# Patient Record
Sex: Male | Born: 1940
Health system: Southern US, Community
[De-identification: ages and names within clinical notes are randomized; demographics above are authoritative.]

## PROBLEM LIST (undated history)

## (undated) DIAGNOSIS — E7800 Pure hypercholesterolemia, unspecified: Secondary | ICD-10-CM

## (undated) DIAGNOSIS — R251 Tremor, unspecified: Secondary | ICD-10-CM

## (undated) DIAGNOSIS — I10 Essential (primary) hypertension: Secondary | ICD-10-CM

## (undated) DIAGNOSIS — Z923 Personal history of irradiation: Secondary | ICD-10-CM

## (undated) DIAGNOSIS — I2510 Atherosclerotic heart disease of native coronary artery without angina pectoris: Secondary | ICD-10-CM

## (undated) DIAGNOSIS — I219 Acute myocardial infarction, unspecified: Secondary | ICD-10-CM

## (undated) DIAGNOSIS — C801 Malignant (primary) neoplasm, unspecified: Secondary | ICD-10-CM

## (undated) HISTORY — PX: CORONARY ANGIOPLASTY WITH STENT PLACEMENT: SHX49

## (undated) HISTORY — DX: Malignant (primary) neoplasm, unspecified: C80.1

## (undated) HISTORY — DX: Pure hypercholesterolemia, unspecified: E78.00

## (undated) HISTORY — PX: APPENDECTOMY: SHX54

---

## 2014-05-12 ENCOUNTER — Emergency Department (HOSPITAL_COMMUNITY)
Admission: EM | Admit: 2014-05-12 | Discharge: 2014-05-12 | Disposition: A | Discharge status: Home / Self Care | Payer: Medicare Other | Attending: Emergency Medicine | Admitting: Emergency Medicine

## 2014-05-12 ENCOUNTER — Emergency Department (HOSPITAL_COMMUNITY): Payer: Medicare Other

## 2014-05-12 ENCOUNTER — Encounter (HOSPITAL_COMMUNITY): Payer: Self-pay | Admitting: Neurology

## 2014-05-12 DIAGNOSIS — S4991XA Unspecified injury of right shoulder and upper arm, initial encounter: Secondary | ICD-10-CM | POA: Diagnosis present

## 2014-05-12 DIAGNOSIS — S0990XA Unspecified injury of head, initial encounter: Secondary | ICD-10-CM | POA: Diagnosis not present

## 2014-05-12 DIAGNOSIS — S42001A Fracture of unspecified part of right clavicle, initial encounter for closed fracture: Secondary | ICD-10-CM

## 2014-05-12 DIAGNOSIS — Z9861 Coronary angioplasty status: Secondary | ICD-10-CM | POA: Insufficient documentation

## 2014-05-12 DIAGNOSIS — Y9384 Activity, sleeping: Secondary | ICD-10-CM | POA: Insufficient documentation

## 2014-05-12 DIAGNOSIS — S42031A Displaced fracture of lateral end of right clavicle, initial encounter for closed fracture: Secondary | ICD-10-CM | POA: Insufficient documentation

## 2014-05-12 DIAGNOSIS — W06XXXA Fall from bed, initial encounter: Secondary | ICD-10-CM | POA: Diagnosis not present

## 2014-05-12 DIAGNOSIS — Y929 Unspecified place or not applicable: Secondary | ICD-10-CM | POA: Insufficient documentation

## 2014-05-12 DIAGNOSIS — I10 Essential (primary) hypertension: Secondary | ICD-10-CM | POA: Diagnosis not present

## 2014-05-12 DIAGNOSIS — Z87891 Personal history of nicotine dependence: Secondary | ICD-10-CM | POA: Diagnosis not present

## 2014-05-12 DIAGNOSIS — Y999 Unspecified external cause status: Secondary | ICD-10-CM | POA: Insufficient documentation

## 2014-05-12 HISTORY — DX: Essential (primary) hypertension: I10

## 2014-05-12 MED ORDER — MORPHINE SULFATE 4 MG/ML IJ SOLN
4.0000 mg | Freq: Once | INTRAMUSCULAR | Status: AC
  Administered 2014-05-12: 4 mg via INTRAVENOUS
  Filled 2014-05-12: qty 1

## 2014-05-12 MED ORDER — HYDROCODONE-ACETAMINOPHEN 5-325 MG PO TABS: 1.0000 | ORAL_TABLET | ORAL | Status: DC | PRN

## 2014-05-12 MED ORDER — SODIUM CHLORIDE 0.9 % IV BOLUS (SEPSIS)
500.0000 mL | Freq: Once | INTRAVENOUS | Status: AC
  Administered 2014-05-12: 500 mL via INTRAVENOUS

## 2014-05-12 MED ORDER — ONDANSETRON HCL 4 MG/2ML IJ SOLN
4.0000 mg | INTRAMUSCULAR | Status: AC
  Administered 2014-05-12: 4 mg via INTRAVENOUS
  Filled 2014-05-12: qty 2

## 2014-05-12 NOTE — ED Notes (Signed)
Pt reports this morning he fell out of the bed, c/o right shoulder pain. Reports he heard a pop. Sensation intact, painful with movement. Denies LOC, takes plavix. Reports hit head on edge of bed, no neck pain.

## 2014-05-12 NOTE — ED Provider Notes (Signed)
CSN: [id number]     Arrival date & time 05/12/14  1049 History   First MD Initiated Contact with Patient 05/12/14 1124     Chief Complaint  Patient presents with  . Shoulder Pain   (Consider location/radiation/quality/duration/timing/severity/associated sxs/prior Treatment) HPI Ruben Conrad is a 74 yo male presenting with shoulder pain.  He states he rolled out of bed while sleeping, appr 3 am this morning, landing on his right side.  He immediately felt pain in his right shoulder and has had difficulty with movement since the fall.  When he is not moving he denies any pain, but on movement of the right arm, he rates the pain as 10/10.  He reports also bumping the back of his head on the bedside table but denies any LOC.   Past Medical History  Diagnosis Date  . Hypertension    Past Surgical History  Procedure Laterality Date  . Coronary angioplasty with stent placement     No family history on file. History  Substance Use Topics  . Smoking status: Former Research scientist (life sciences)  . Smokeless tobacco: Not on file  . Alcohol Use: No    Review of Systems  Constitutional: Negative for fever and chills.  HENT: Negative for sore throat.   Eyes: Negative for visual disturbance.  Respiratory: Negative for cough and shortness of breath.   Cardiovascular: Negative for chest pain and leg swelling.  Gastrointestinal: Negative for nausea, vomiting and diarrhea.  Genitourinary: Negative for dysuria.  Musculoskeletal: Positive for myalgias and arthralgias.  Skin: Negative for rash.  Neurological: Negative for weakness, numbness and headaches.      Allergies  Review of patient's allergies indicates no known allergies.  Home Medications   Prior to Admission medications   Not on File   BP 107/71 mmHg  Pulse 59  Temp(Src) 98.2 F (36.8 C) (Oral)  Resp 15  SpO2 94% Physical Exam  Constitutional: He is oriented to person, place, and time. He appears well-developed and well-nourished. No  distress.  HENT:  Head: Normocephalic and atraumatic.  Mouth/Throat: Oropharynx is clear and moist. No oropharyngeal exudate.  Eyes: Conjunctivae are normal.  Neck: Neck supple. No thyromegaly present.  Cardiovascular: Normal rate, regular rhythm and intact distal pulses.   Pulmonary/Chest: Effort normal and breath sounds normal. No respiratory distress. He has no wheezes. He has no rales. He exhibits no tenderness.  Abdominal: Soft. There is no tenderness.  Musculoskeletal: He exhibits tenderness.  Clavicle crepitus,  TTP over right shoulder, clavicle and scapula. N/V intact. Decreased ROM   Lymphadenopathy:    He has no cervical adenopathy.  Neurological: He is alert and oriented to person, place, and time. He has normal strength. No cranial nerve deficit or sensory deficit. GCS eye subscore is 4. GCS verbal subscore is 5. GCS motor subscore is 6.  Cranial nerves 2-12 intact  Skin: Skin is warm and dry. No rash noted. He is not diaphoretic.  Psychiatric: He has a normal mood and affect.  Nursing note and vitals reviewed.   ED Course  Procedures (including critical care time) Labs Review Labs Reviewed - No data to display  Imaging Review Dg Chest 2 View  05/12/2014   CLINICAL DATA:  Shoulder pain. Fell today. Pain in the right shoulder.  EXAM: CHEST  2 VIEW  COMPARISON:  None.  FINDINGS: Heart size is within normal limits. There is minimal subsegmental atelectasis in the left lower lobe. There are no focal consolidations or pleural effusions. No pulmonary edema. No pneumothorax or  acute, displaced rib fractures. Note is made of deformity of the right shoulder, further evaluated on shoulder films of the same day.  IMPRESSION: No evidence for acute cardiopulmonary abnormality.  Deformity of the right shoulder.   Electronically Signed   By: Nolon Nations M.D.   On: 05/12/2014 12:51   Dg Shoulder Right  05/12/2014   CLINICAL DATA:  RIGHT shoulder pain, fell today, initial encounter   EXAM: RIGHT SHOULDER - 2+ VIEW  COMPARISON:  None  FINDINGS: Osseous demineralization.  Displaced distal RIGHT clavicular fracture.  AC joint alignment suboptimally delineated.  No glenohumeral fracture or dislocation.  Visualized RIGHT ribs intact.  IMPRESSION: Displaced distal RIGHT clavicular fracture.   Electronically Signed   By: Lavonia Dana M.D.   On: 05/12/2014 12:48   Ct Head Wo Contrast  05/12/2014   CLINICAL DATA:  [place] out of bed.  Pain.  EXAM: CT HEAD WITHOUT CONTRAST  CT CERVICAL SPINE WITHOUT CONTRAST  TECHNIQUE: Multidetector CT imaging of the head and cervical spine was performed following the standard protocol without intravenous contrast. Multiplanar CT image reconstructions of the cervical spine were also generated.  COMPARISON:  None.  FINDINGS: CT HEAD FINDINGS  Generalized atrophy. Negative for hydrocephalus. Mild chronic microvascular ischemic change in the white matter.  Negative for acute infarct, hemorrhage, or mass lesion. Negative for skull fracture.  CT CERVICAL SPINE FINDINGS  Normal cervical alignment.  Negative for fracture or mass  Disc degeneration and spondylosis C3 through C7 left greater than right with uncinate spurring. Left foraminal narrowing at C3-4 and C4-5 and C5-6.  IMPRESSION: No acute abnormality in the head or cervical spine.   Electronically Signed   By: Franchot Gallo M.D.   On: 05/12/2014 12:27   Ct Cervical Spine Wo Contrast  05/12/2014   CLINICAL DATA:  [place] out of bed.  Pain.  EXAM: CT HEAD WITHOUT CONTRAST  CT CERVICAL SPINE WITHOUT CONTRAST  TECHNIQUE: Multidetector CT imaging of the head and cervical spine was performed following the standard protocol without intravenous contrast. Multiplanar CT image reconstructions of the cervical spine were also generated.  COMPARISON:  None.  FINDINGS: CT HEAD FINDINGS  Generalized atrophy. Negative for hydrocephalus. Mild chronic microvascular ischemic change in the white matter.  Negative for acute infarct,  hemorrhage, or mass lesion. Negative for skull fracture.  CT CERVICAL SPINE FINDINGS  Normal cervical alignment.  Negative for fracture or mass  Disc degeneration and spondylosis C3 through C7 left greater than right with uncinate spurring. Left foraminal narrowing at C3-4 and C4-5 and C5-6.  IMPRESSION: No acute abnormality in the head or cervical spine.   Electronically Signed   By: Franchot Gallo M.D.   On: 05/12/2014 12:27     EKG Interpretation None      MDM   Final diagnoses:  Clavicle fracture, right, closed, initial encounter   74 yo presents with with shoulder pain after mechanical fall. His x-ray shows displaced distal right clavicular fracture. CXR negative for pneumothorax and he is n/v intact distally.  Discussed case with Dr. Darl Householder. Pain managed in ED. Sling provided and advised to follow up with orthopedics as soon as possible for mgmt of clavicle fracture. Pt is well-appearing, in no acute distress and vital signs reviewed and not concerning. He appears safe to be discharged.  Return precautions provided.    Patient will be dc home & is agreeable with above plan.   Filed Vitals:   05/12/14 1300 05/12/14 1315 05/12/14 1356 05/12/14 1415  BP: 123/72 125/72 90/76 118/62  Pulse: 65 58 67 62  Temp:      TempSrc:      Resp:   20   SpO2: 92% 91% 97% 95%   Meds given in ED:  Medications  morphine 4 MG/ML injection 4 mg (4 mg Intravenous Given 05/12/14 1253)  ondansetron (ZOFRAN) injection 4 mg (4 mg Intravenous Given 05/12/14 1253)  sodium chloride 0.9 % bolus 500 mL (0 mLs Intravenous Stopped 05/12/14 1423)    Discharge Medication List as of 05/12/2014  1:45 PM    START taking these medications   Details  HYDROcodone-acetaminophen (NORCO/VICODIN) 5-325 MG per tablet Take 1-2 tablets by mouth every 4 (four) hours as needed., Starting 05/12/2014, Until Discontinued, Print           Britt Bottom, NP 05/14/14 8264  Wandra Arthurs, MD 05/15/14 1045

## 2014-05-12 NOTE — ED Notes (Signed)
Pt is still in imaging.

## 2014-05-12 NOTE — Discharge Instructions (Signed)
Please follow the directions provided.  Be sure to follow-up with the orthopedic doctor for further management.  Wear your sling to immobilize your arm while it is healing.  You may take vicodin every 4 hours for pain.  Don't hesitate to return for any new, worsening or concerning symptoms.     SEEK MEDICAL CARE IF:  Your medicine is not helping to relieve pain and swelling.  SEEK IMMEDIATE MEDICAL CARE IF:  Your arm is numb, cold, or pale, even when the splint is loose.

## 2014-05-12 NOTE — ED Notes (Signed)
I gave the patient a cup of ice water per Dr. Darl Householder.

## 2014-05-21 ENCOUNTER — Other Ambulatory Visit: Payer: Self-pay | Admitting: Family Medicine

## 2014-05-21 DIAGNOSIS — I714 Abdominal aortic aneurysm, without rupture, unspecified: Secondary | ICD-10-CM

## 2014-05-27 ENCOUNTER — Ambulatory Visit
Admission: RE | Admit: 2014-05-27 | Discharge: 2014-05-27 | Disposition: A | Discharge status: Home / Self Care | Payer: Medicare Other | Source: Ambulatory Visit | Attending: Family Medicine | Admitting: Family Medicine

## 2014-05-27 DIAGNOSIS — I714 Abdominal aortic aneurysm, without rupture, unspecified: Secondary | ICD-10-CM

## 2015-06-15 DIAGNOSIS — I10 Essential (primary) hypertension: Secondary | ICD-10-CM | POA: Diagnosis not present

## 2015-06-15 DIAGNOSIS — G250 Essential tremor: Secondary | ICD-10-CM | POA: Diagnosis not present

## 2015-06-15 DIAGNOSIS — Z0000 Encounter for general adult medical examination without abnormal findings: Secondary | ICD-10-CM | POA: Diagnosis not present

## 2015-06-15 DIAGNOSIS — Z1389 Encounter for screening for other disorder: Secondary | ICD-10-CM | POA: Diagnosis not present

## 2015-06-15 DIAGNOSIS — E782 Mixed hyperlipidemia: Secondary | ICD-10-CM | POA: Diagnosis not present

## 2015-12-17 DIAGNOSIS — D72829 Elevated white blood cell count, unspecified: Secondary | ICD-10-CM | POA: Diagnosis not present

## 2015-12-17 DIAGNOSIS — R05 Cough: Secondary | ICD-10-CM | POA: Diagnosis not present

## 2015-12-17 DIAGNOSIS — M25552 Pain in left hip: Secondary | ICD-10-CM | POA: Diagnosis not present

## 2015-12-17 DIAGNOSIS — W19XXXA Unspecified fall, initial encounter: Secondary | ICD-10-CM | POA: Diagnosis not present

## 2015-12-17 DIAGNOSIS — I700 Atherosclerosis of aorta: Secondary | ICD-10-CM | POA: Diagnosis not present

## 2015-12-17 DIAGNOSIS — R279 Unspecified lack of coordination: Secondary | ICD-10-CM | POA: Diagnosis not present

## 2015-12-17 DIAGNOSIS — I10 Essential (primary) hypertension: Secondary | ICD-10-CM | POA: Diagnosis not present

## 2015-12-17 DIAGNOSIS — I2510 Atherosclerotic heart disease of native coronary artery without angina pectoris: Secondary | ICD-10-CM | POA: Diagnosis not present

## 2015-12-17 DIAGNOSIS — S72145A Nondisplaced intertrochanteric fracture of left femur, initial encounter for closed fracture: Secondary | ICD-10-CM | POA: Diagnosis not present

## 2015-12-17 DIAGNOSIS — S7290XA Unspecified fracture of unspecified femur, initial encounter for closed fracture: Secondary | ICD-10-CM | POA: Diagnosis not present

## 2015-12-17 DIAGNOSIS — M6281 Muscle weakness (generalized): Secondary | ICD-10-CM | POA: Diagnosis not present

## 2015-12-17 DIAGNOSIS — G250 Essential tremor: Secondary | ICD-10-CM | POA: Diagnosis not present

## 2015-12-17 DIAGNOSIS — S7290XS Unspecified fracture of unspecified femur, sequela: Secondary | ICD-10-CM | POA: Diagnosis not present

## 2015-12-17 DIAGNOSIS — E785 Hyperlipidemia, unspecified: Secondary | ICD-10-CM | POA: Diagnosis not present

## 2015-12-17 DIAGNOSIS — S7222XD Displaced subtrochanteric fracture of left femur, subsequent encounter for closed fracture with routine healing: Secondary | ICD-10-CM | POA: Diagnosis not present

## 2015-12-17 DIAGNOSIS — R262 Difficulty in walking, not elsewhere classified: Secondary | ICD-10-CM | POA: Diagnosis not present

## 2015-12-17 DIAGNOSIS — Z7902 Long term (current) use of antithrombotics/antiplatelets: Secondary | ICD-10-CM | POA: Diagnosis not present

## 2015-12-17 DIAGNOSIS — R5381 Other malaise: Secondary | ICD-10-CM | POA: Diagnosis not present

## 2015-12-17 DIAGNOSIS — E7800 Pure hypercholesterolemia, unspecified: Secondary | ICD-10-CM | POA: Diagnosis not present

## 2015-12-17 DIAGNOSIS — S7222XA Displaced subtrochanteric fracture of left femur, initial encounter for closed fracture: Secondary | ICD-10-CM | POA: Diagnosis not present

## 2015-12-17 DIAGNOSIS — S72142A Displaced intertrochanteric fracture of left femur, initial encounter for closed fracture: Secondary | ICD-10-CM | POA: Diagnosis not present

## 2015-12-17 DIAGNOSIS — D649 Anemia, unspecified: Secondary | ICD-10-CM | POA: Diagnosis not present

## 2015-12-17 DIAGNOSIS — M47816 Spondylosis without myelopathy or radiculopathy, lumbar region: Secondary | ICD-10-CM | POA: Diagnosis not present

## 2015-12-17 DIAGNOSIS — D62 Acute posthemorrhagic anemia: Secondary | ICD-10-CM | POA: Diagnosis not present

## 2015-12-24 DIAGNOSIS — E785 Hyperlipidemia, unspecified: Secondary | ICD-10-CM | POA: Diagnosis not present

## 2015-12-24 DIAGNOSIS — R5381 Other malaise: Secondary | ICD-10-CM | POA: Diagnosis not present

## 2015-12-24 DIAGNOSIS — S7222XD Displaced subtrochanteric fracture of left femur, subsequent encounter for closed fracture with routine healing: Secondary | ICD-10-CM | POA: Diagnosis not present

## 2015-12-24 DIAGNOSIS — I10 Essential (primary) hypertension: Secondary | ICD-10-CM | POA: Diagnosis not present

## 2015-12-24 DIAGNOSIS — R262 Difficulty in walking, not elsewhere classified: Secondary | ICD-10-CM | POA: Diagnosis not present

## 2015-12-24 DIAGNOSIS — S72009G Fracture of unspecified part of neck of unspecified femur, subsequent encounter for closed fracture with delayed healing: Secondary | ICD-10-CM | POA: Diagnosis not present

## 2015-12-24 DIAGNOSIS — M6281 Muscle weakness (generalized): Secondary | ICD-10-CM | POA: Diagnosis not present

## 2015-12-24 DIAGNOSIS — I25118 Atherosclerotic heart disease of native coronary artery with other forms of angina pectoris: Secondary | ICD-10-CM | POA: Diagnosis not present

## 2015-12-27 DIAGNOSIS — S72009G Fracture of unspecified part of neck of unspecified femur, subsequent encounter for closed fracture with delayed healing: Secondary | ICD-10-CM | POA: Diagnosis not present

## 2015-12-27 DIAGNOSIS — R5381 Other malaise: Secondary | ICD-10-CM | POA: Diagnosis not present

## 2015-12-27 DIAGNOSIS — I10 Essential (primary) hypertension: Secondary | ICD-10-CM | POA: Diagnosis not present

## 2015-12-27 DIAGNOSIS — I25118 Atherosclerotic heart disease of native coronary artery with other forms of angina pectoris: Secondary | ICD-10-CM | POA: Diagnosis not present

## 2015-12-28 DIAGNOSIS — R5381 Other malaise: Secondary | ICD-10-CM | POA: Diagnosis not present

## 2015-12-28 DIAGNOSIS — S72009G Fracture of unspecified part of neck of unspecified femur, subsequent encounter for closed fracture with delayed healing: Secondary | ICD-10-CM | POA: Diagnosis not present

## 2015-12-28 DIAGNOSIS — I25118 Atherosclerotic heart disease of native coronary artery with other forms of angina pectoris: Secondary | ICD-10-CM | POA: Diagnosis not present

## 2015-12-28 DIAGNOSIS — I10 Essential (primary) hypertension: Secondary | ICD-10-CM | POA: Diagnosis not present

## 2015-12-29 DIAGNOSIS — I25118 Atherosclerotic heart disease of native coronary artery with other forms of angina pectoris: Secondary | ICD-10-CM | POA: Diagnosis not present

## 2015-12-29 DIAGNOSIS — S72009G Fracture of unspecified part of neck of unspecified femur, subsequent encounter for closed fracture with delayed healing: Secondary | ICD-10-CM | POA: Diagnosis not present

## 2015-12-29 DIAGNOSIS — I10 Essential (primary) hypertension: Secondary | ICD-10-CM | POA: Diagnosis not present

## 2015-12-29 DIAGNOSIS — R5381 Other malaise: Secondary | ICD-10-CM | POA: Diagnosis not present

## 2016-01-04 DIAGNOSIS — Z4889 Encounter for other specified surgical aftercare: Secondary | ICD-10-CM | POA: Diagnosis not present

## 2016-01-04 DIAGNOSIS — S72142D Displaced intertrochanteric fracture of left femur, subsequent encounter for closed fracture with routine healing: Secondary | ICD-10-CM | POA: Diagnosis not present

## 2016-01-10 DIAGNOSIS — S7292XD Unspecified fracture of left femur, subsequent encounter for closed fracture with routine healing: Secondary | ICD-10-CM | POA: Diagnosis not present

## 2016-01-10 DIAGNOSIS — M2560 Stiffness of unspecified joint, not elsewhere classified: Secondary | ICD-10-CM | POA: Diagnosis not present

## 2016-01-10 DIAGNOSIS — R2681 Unsteadiness on feet: Secondary | ICD-10-CM | POA: Diagnosis not present

## 2016-01-10 DIAGNOSIS — Z4789 Encounter for other orthopedic aftercare: Secondary | ICD-10-CM | POA: Diagnosis not present

## 2016-01-10 DIAGNOSIS — R2689 Other abnormalities of gait and mobility: Secondary | ICD-10-CM | POA: Diagnosis not present

## 2016-01-10 DIAGNOSIS — M25552 Pain in left hip: Secondary | ICD-10-CM | POA: Diagnosis not present

## 2016-01-18 DIAGNOSIS — R2689 Other abnormalities of gait and mobility: Secondary | ICD-10-CM | POA: Diagnosis not present

## 2016-01-18 DIAGNOSIS — S7292XD Unspecified fracture of left femur, subsequent encounter for closed fracture with routine healing: Secondary | ICD-10-CM | POA: Diagnosis not present

## 2016-01-18 DIAGNOSIS — M25552 Pain in left hip: Secondary | ICD-10-CM | POA: Diagnosis not present

## 2016-01-18 DIAGNOSIS — Z4789 Encounter for other orthopedic aftercare: Secondary | ICD-10-CM | POA: Diagnosis not present

## 2016-01-18 DIAGNOSIS — R2681 Unsteadiness on feet: Secondary | ICD-10-CM | POA: Diagnosis not present

## 2016-01-18 DIAGNOSIS — M2560 Stiffness of unspecified joint, not elsewhere classified: Secondary | ICD-10-CM | POA: Diagnosis not present

## 2016-01-24 HISTORY — PX: HIP FRACTURE SURGERY: SHX118

## 2016-02-01 DIAGNOSIS — M2560 Stiffness of unspecified joint, not elsewhere classified: Secondary | ICD-10-CM | POA: Diagnosis not present

## 2016-02-01 DIAGNOSIS — R2681 Unsteadiness on feet: Secondary | ICD-10-CM | POA: Diagnosis not present

## 2016-02-01 DIAGNOSIS — M6281 Muscle weakness (generalized): Secondary | ICD-10-CM | POA: Diagnosis not present

## 2016-02-01 DIAGNOSIS — Z4789 Encounter for other orthopedic aftercare: Secondary | ICD-10-CM | POA: Diagnosis not present

## 2016-02-01 DIAGNOSIS — R2689 Other abnormalities of gait and mobility: Secondary | ICD-10-CM | POA: Diagnosis not present

## 2016-02-01 DIAGNOSIS — S7292XD Unspecified fracture of left femur, subsequent encounter for closed fracture with routine healing: Secondary | ICD-10-CM | POA: Diagnosis not present

## 2016-02-01 DIAGNOSIS — M25552 Pain in left hip: Secondary | ICD-10-CM | POA: Diagnosis not present

## 2016-02-03 DIAGNOSIS — M2560 Stiffness of unspecified joint, not elsewhere classified: Secondary | ICD-10-CM | POA: Diagnosis not present

## 2016-02-03 DIAGNOSIS — R2681 Unsteadiness on feet: Secondary | ICD-10-CM | POA: Diagnosis not present

## 2016-02-03 DIAGNOSIS — Z4789 Encounter for other orthopedic aftercare: Secondary | ICD-10-CM | POA: Diagnosis not present

## 2016-02-03 DIAGNOSIS — S7292XD Unspecified fracture of left femur, subsequent encounter for closed fracture with routine healing: Secondary | ICD-10-CM | POA: Diagnosis not present

## 2016-02-03 DIAGNOSIS — R2689 Other abnormalities of gait and mobility: Secondary | ICD-10-CM | POA: Diagnosis not present

## 2016-02-03 DIAGNOSIS — M25552 Pain in left hip: Secondary | ICD-10-CM | POA: Diagnosis not present

## 2016-02-07 DIAGNOSIS — M25552 Pain in left hip: Secondary | ICD-10-CM | POA: Diagnosis not present

## 2016-02-07 DIAGNOSIS — R2681 Unsteadiness on feet: Secondary | ICD-10-CM | POA: Diagnosis not present

## 2016-02-07 DIAGNOSIS — M2560 Stiffness of unspecified joint, not elsewhere classified: Secondary | ICD-10-CM | POA: Diagnosis not present

## 2016-02-07 DIAGNOSIS — Z4789 Encounter for other orthopedic aftercare: Secondary | ICD-10-CM | POA: Diagnosis not present

## 2016-02-07 DIAGNOSIS — R2689 Other abnormalities of gait and mobility: Secondary | ICD-10-CM | POA: Diagnosis not present

## 2016-02-07 DIAGNOSIS — S7292XD Unspecified fracture of left femur, subsequent encounter for closed fracture with routine healing: Secondary | ICD-10-CM | POA: Diagnosis not present

## 2016-02-10 DIAGNOSIS — R2681 Unsteadiness on feet: Secondary | ICD-10-CM | POA: Diagnosis not present

## 2016-02-10 DIAGNOSIS — S7292XD Unspecified fracture of left femur, subsequent encounter for closed fracture with routine healing: Secondary | ICD-10-CM | POA: Diagnosis not present

## 2016-02-10 DIAGNOSIS — R2689 Other abnormalities of gait and mobility: Secondary | ICD-10-CM | POA: Diagnosis not present

## 2016-02-10 DIAGNOSIS — Z4789 Encounter for other orthopedic aftercare: Secondary | ICD-10-CM | POA: Diagnosis not present

## 2016-02-10 DIAGNOSIS — M25552 Pain in left hip: Secondary | ICD-10-CM | POA: Diagnosis not present

## 2016-02-10 DIAGNOSIS — M2560 Stiffness of unspecified joint, not elsewhere classified: Secondary | ICD-10-CM | POA: Diagnosis not present

## 2016-02-15 DIAGNOSIS — Z4889 Encounter for other specified surgical aftercare: Secondary | ICD-10-CM | POA: Diagnosis not present

## 2016-02-16 DIAGNOSIS — S7292XD Unspecified fracture of left femur, subsequent encounter for closed fracture with routine healing: Secondary | ICD-10-CM | POA: Diagnosis not present

## 2016-02-16 DIAGNOSIS — Z4789 Encounter for other orthopedic aftercare: Secondary | ICD-10-CM | POA: Diagnosis not present

## 2016-02-16 DIAGNOSIS — M25552 Pain in left hip: Secondary | ICD-10-CM | POA: Diagnosis not present

## 2016-02-16 DIAGNOSIS — M2560 Stiffness of unspecified joint, not elsewhere classified: Secondary | ICD-10-CM | POA: Diagnosis not present

## 2016-02-16 DIAGNOSIS — R2681 Unsteadiness on feet: Secondary | ICD-10-CM | POA: Diagnosis not present

## 2016-02-16 DIAGNOSIS — R2689 Other abnormalities of gait and mobility: Secondary | ICD-10-CM | POA: Diagnosis not present

## 2016-02-18 DIAGNOSIS — S7292XD Unspecified fracture of left femur, subsequent encounter for closed fracture with routine healing: Secondary | ICD-10-CM | POA: Diagnosis not present

## 2016-02-18 DIAGNOSIS — M25552 Pain in left hip: Secondary | ICD-10-CM | POA: Diagnosis not present

## 2016-02-18 DIAGNOSIS — R2681 Unsteadiness on feet: Secondary | ICD-10-CM | POA: Diagnosis not present

## 2016-02-18 DIAGNOSIS — Z4789 Encounter for other orthopedic aftercare: Secondary | ICD-10-CM | POA: Diagnosis not present

## 2016-02-18 DIAGNOSIS — R2689 Other abnormalities of gait and mobility: Secondary | ICD-10-CM | POA: Diagnosis not present

## 2016-02-18 DIAGNOSIS — M2560 Stiffness of unspecified joint, not elsewhere classified: Secondary | ICD-10-CM | POA: Diagnosis not present

## 2016-02-23 DIAGNOSIS — Z4789 Encounter for other orthopedic aftercare: Secondary | ICD-10-CM | POA: Diagnosis not present

## 2016-02-23 DIAGNOSIS — R2681 Unsteadiness on feet: Secondary | ICD-10-CM | POA: Diagnosis not present

## 2016-02-23 DIAGNOSIS — M25552 Pain in left hip: Secondary | ICD-10-CM | POA: Diagnosis not present

## 2016-02-23 DIAGNOSIS — R2689 Other abnormalities of gait and mobility: Secondary | ICD-10-CM | POA: Diagnosis not present

## 2016-02-23 DIAGNOSIS — S7292XD Unspecified fracture of left femur, subsequent encounter for closed fracture with routine healing: Secondary | ICD-10-CM | POA: Diagnosis not present

## 2016-02-23 DIAGNOSIS — M2560 Stiffness of unspecified joint, not elsewhere classified: Secondary | ICD-10-CM | POA: Diagnosis not present

## 2016-02-25 DIAGNOSIS — M2560 Stiffness of unspecified joint, not elsewhere classified: Secondary | ICD-10-CM | POA: Diagnosis not present

## 2016-02-25 DIAGNOSIS — M25552 Pain in left hip: Secondary | ICD-10-CM | POA: Diagnosis not present

## 2016-02-25 DIAGNOSIS — R2681 Unsteadiness on feet: Secondary | ICD-10-CM | POA: Diagnosis not present

## 2016-02-25 DIAGNOSIS — R2689 Other abnormalities of gait and mobility: Secondary | ICD-10-CM | POA: Diagnosis not present

## 2016-02-25 DIAGNOSIS — S7292XD Unspecified fracture of left femur, subsequent encounter for closed fracture with routine healing: Secondary | ICD-10-CM | POA: Diagnosis not present

## 2016-02-25 DIAGNOSIS — Z4789 Encounter for other orthopedic aftercare: Secondary | ICD-10-CM | POA: Diagnosis not present

## 2016-03-01 DIAGNOSIS — M25552 Pain in left hip: Secondary | ICD-10-CM | POA: Diagnosis not present

## 2016-03-01 DIAGNOSIS — Z4789 Encounter for other orthopedic aftercare: Secondary | ICD-10-CM | POA: Diagnosis not present

## 2016-03-01 DIAGNOSIS — R2681 Unsteadiness on feet: Secondary | ICD-10-CM | POA: Diagnosis not present

## 2016-03-01 DIAGNOSIS — R2689 Other abnormalities of gait and mobility: Secondary | ICD-10-CM | POA: Diagnosis not present

## 2016-03-01 DIAGNOSIS — S7292XD Unspecified fracture of left femur, subsequent encounter for closed fracture with routine healing: Secondary | ICD-10-CM | POA: Diagnosis not present

## 2016-03-01 DIAGNOSIS — M2560 Stiffness of unspecified joint, not elsewhere classified: Secondary | ICD-10-CM | POA: Diagnosis not present

## 2016-06-21 DIAGNOSIS — Z1389 Encounter for screening for other disorder: Secondary | ICD-10-CM | POA: Diagnosis not present

## 2016-06-21 DIAGNOSIS — I2510 Atherosclerotic heart disease of native coronary artery without angina pectoris: Secondary | ICD-10-CM | POA: Diagnosis not present

## 2016-06-21 DIAGNOSIS — E782 Mixed hyperlipidemia: Secondary | ICD-10-CM | POA: Diagnosis not present

## 2016-06-21 DIAGNOSIS — Z0000 Encounter for general adult medical examination without abnormal findings: Secondary | ICD-10-CM | POA: Diagnosis not present

## 2016-06-21 DIAGNOSIS — G250 Essential tremor: Secondary | ICD-10-CM | POA: Diagnosis not present

## 2016-06-21 DIAGNOSIS — I10 Essential (primary) hypertension: Secondary | ICD-10-CM | POA: Diagnosis not present

## 2016-06-21 DIAGNOSIS — D692 Other nonthrombocytopenic purpura: Secondary | ICD-10-CM | POA: Diagnosis not present

## 2017-07-13 DIAGNOSIS — I10 Essential (primary) hypertension: Secondary | ICD-10-CM | POA: Diagnosis not present

## 2017-07-13 DIAGNOSIS — Z1389 Encounter for screening for other disorder: Secondary | ICD-10-CM | POA: Diagnosis not present

## 2017-07-13 DIAGNOSIS — E782 Mixed hyperlipidemia: Secondary | ICD-10-CM | POA: Diagnosis not present

## 2017-07-13 DIAGNOSIS — Z0000 Encounter for general adult medical examination without abnormal findings: Secondary | ICD-10-CM | POA: Diagnosis not present

## 2017-07-13 DIAGNOSIS — Z8781 Personal history of (healed) traumatic fracture: Secondary | ICD-10-CM | POA: Diagnosis not present

## 2017-07-13 DIAGNOSIS — G250 Essential tremor: Secondary | ICD-10-CM | POA: Diagnosis not present

## 2017-07-13 DIAGNOSIS — I2510 Atherosclerotic heart disease of native coronary artery without angina pectoris: Secondary | ICD-10-CM | POA: Diagnosis not present

## 2017-07-13 DIAGNOSIS — D692 Other nonthrombocytopenic purpura: Secondary | ICD-10-CM | POA: Diagnosis not present

## 2017-07-13 DIAGNOSIS — H1013 Acute atopic conjunctivitis, bilateral: Secondary | ICD-10-CM | POA: Diagnosis not present

## 2017-09-19 DIAGNOSIS — Z8781 Personal history of (healed) traumatic fracture: Secondary | ICD-10-CM | POA: Diagnosis not present

## 2017-09-19 DIAGNOSIS — M8588 Other specified disorders of bone density and structure, other site: Secondary | ICD-10-CM | POA: Diagnosis not present

## 2017-12-26 ENCOUNTER — Encounter: Payer: Self-pay | Admitting: Diagnostic Neuroimaging

## 2017-12-26 ENCOUNTER — Ambulatory Visit: Payer: Medicare HMO | Admitting: Diagnostic Neuroimaging

## 2017-12-26 VITALS — BP 109/70 | HR 62 | Ht 74.0 in | Wt 180.0 lb

## 2017-12-26 DIAGNOSIS — G250 Essential tremor: Secondary | ICD-10-CM | POA: Diagnosis not present

## 2017-12-26 MED ORDER — CARBIDOPA-LEVODOPA 25-100 MG PO TABS: 1.0000 | ORAL_TABLET | Freq: Three times a day (TID) | ORAL | 12 refills | Status: DC
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
## 2017-12-26 NOTE — Patient Instructions (Signed)
  Start carbidopa / levodopa (25/100)  - start half tab three times a day (30 minutes before meals)  - after 1-2 weeks, increase to 1 full tab three times a day (30 minutes before meals)

## 2018-01-01 DIAGNOSIS — R5383 Other fatigue: Secondary | ICD-10-CM | POA: Diagnosis not present

## 2018-01-01 DIAGNOSIS — J309 Allergic rhinitis, unspecified: Secondary | ICD-10-CM | POA: Diagnosis not present

## 2018-01-01 DIAGNOSIS — R490 Dysphonia: Secondary | ICD-10-CM | POA: Diagnosis not present

## 2018-01-01 DIAGNOSIS — I10 Essential (primary) hypertension: Secondary | ICD-10-CM | POA: Diagnosis not present

## 2018-01-18 DIAGNOSIS — R490 Dysphonia: Secondary | ICD-10-CM | POA: Diagnosis not present

## 2018-01-29 ENCOUNTER — Telehealth: Payer: Self-pay | Admitting: Diagnostic Neuroimaging

## 2018-01-29 NOTE — Telephone Encounter (Signed)
Patients daughter called and stated that Dr. Leta Baptist had requested an update on how the rx Carbidopa-levobopa 25 mg was working for the patient. She states that he is "half better" she would like to speak with the nurse regarding this. Please call and advise.

## 2018-01-29 NOTE — Telephone Encounter (Signed)
Discussed with Dr Leta Baptist. LVM for Ruben Conrad advising her that per Dr Leta Baptist, the patient may increase carb-levo to taking one tab 4 x daily or 1.5 tabs three x daily if he wants to try that for tremors. Advised Dr Leta Baptist agrees with ENT referral for hoarseness and shortness of breath evaluation.  Advised her this office is closed, left number for any questions tomorrow.

## 2018-01-29 NOTE — Telephone Encounter (Signed)
Returned call to daughter Kennyth Lose, on [place] who stated the patient states he "is half better" with tremors. Kennyth Lose stated he is able to hold a spoon now. She stated he is now on Carb-Levo, one tab three x daily, has been for 3 weeks. She reported his hoarseness is no better. His PCP prescribed antibiotics, but it did not clear up. The patient has appt with ENT next Thurs. Kennyth Lose stated he will get "out of breath " when talking at times. She wanted Dr Leta Baptist to be updated on patient.  This RN advised will let D Penumalli know and call her back if he has any advice, instructions. She  verbalized understanding, appreciation.

## 2018-02-07 DIAGNOSIS — J343 Hypertrophy of nasal turbinates: Secondary | ICD-10-CM | POA: Diagnosis not present

## 2018-02-07 DIAGNOSIS — Z972 Presence of dental prosthetic device (complete) (partial): Secondary | ICD-10-CM | POA: Diagnosis not present

## 2018-02-07 DIAGNOSIS — Z87891 Personal history of nicotine dependence: Secondary | ICD-10-CM | POA: Diagnosis not present

## 2018-02-07 DIAGNOSIS — J383 Other diseases of vocal cords: Secondary | ICD-10-CM | POA: Diagnosis not present

## 2018-02-22 ENCOUNTER — Other Ambulatory Visit: Payer: Self-pay

## 2018-02-22 ENCOUNTER — Encounter (HOSPITAL_COMMUNITY): Payer: Self-pay | Admitting: *Deleted

## 2018-02-22 NOTE — Progress Notes (Signed)
Called pt for pre-op call and he asked that I call his daughter, Kennyth Lose to get the information. I spoke with Kennyth Lose, she was able to give pt's medical history. She states pt has had a MI in the past with stents, she thinks it was more than 10 years ago. States pt does not have a cardiologist but Dr. Maury Dus prescribes the Aspirin and Plavix. Last dose of those were yesterday, 02/21/18. Lisa at Dr. Redmond Baseman office confirmed with Dr. Noland Fordyce office that it was ok that pt stop both meds. Pt has tremors, but has not officially been diagnosed with Parkinson's. Has started on Carbidopa-Levodopa this month. She states the tremors seem a little less.  I have requested the last office visit note, most recent EKG tracing and the surgery clearance from Dr. Noland Fordyce office.

## 2018-02-25 ENCOUNTER — Ambulatory Visit (HOSPITAL_COMMUNITY)
Admission: RE | Admit: 2018-02-25 | Discharge: 2018-02-25 | Disposition: A | Discharge status: Home / Self Care | Payer: Medicare HMO | Attending: Otolaryngology | Admitting: Otolaryngology

## 2018-02-25 ENCOUNTER — Ambulatory Visit (HOSPITAL_COMMUNITY): Payer: Medicare HMO | Admitting: Certified Registered Nurse Anesthetist

## 2018-02-25 ENCOUNTER — Encounter (HOSPITAL_COMMUNITY): Payer: Self-pay | Admitting: *Deleted

## 2018-02-25 ENCOUNTER — Encounter (HOSPITAL_COMMUNITY)
Admission: RE | Disposition: A | Discharge status: Home / Self Care | Payer: Self-pay | Source: Home / Self Care | Attending: Otolaryngology

## 2018-02-25 DIAGNOSIS — I2510 Atherosclerotic heart disease of native coronary artery without angina pectoris: Secondary | ICD-10-CM | POA: Diagnosis not present

## 2018-02-25 DIAGNOSIS — J383 Other diseases of vocal cords: Secondary | ICD-10-CM | POA: Diagnosis not present

## 2018-02-25 DIAGNOSIS — R490 Dysphonia: Secondary | ICD-10-CM | POA: Diagnosis not present

## 2018-02-25 DIAGNOSIS — I10 Essential (primary) hypertension: Secondary | ICD-10-CM | POA: Diagnosis not present

## 2018-02-25 DIAGNOSIS — Z7902 Long term (current) use of antithrombotics/antiplatelets: Secondary | ICD-10-CM | POA: Insufficient documentation

## 2018-02-25 DIAGNOSIS — C320 Malignant neoplasm of glottis: Secondary | ICD-10-CM | POA: Diagnosis not present

## 2018-02-25 DIAGNOSIS — Z7982 Long term (current) use of aspirin: Secondary | ICD-10-CM | POA: Insufficient documentation

## 2018-02-25 DIAGNOSIS — R251 Tremor, unspecified: Secondary | ICD-10-CM | POA: Insufficient documentation

## 2018-02-25 DIAGNOSIS — I252 Old myocardial infarction: Secondary | ICD-10-CM | POA: Insufficient documentation

## 2018-02-25 DIAGNOSIS — E7800 Pure hypercholesterolemia, unspecified: Secondary | ICD-10-CM | POA: Diagnosis not present

## 2018-02-25 DIAGNOSIS — Z79899 Other long term (current) drug therapy: Secondary | ICD-10-CM | POA: Diagnosis not present

## 2018-02-25 DIAGNOSIS — Z888 Allergy status to other drugs, medicaments and biological substances status: Secondary | ICD-10-CM | POA: Diagnosis not present

## 2018-02-25 DIAGNOSIS — Z87891 Personal history of nicotine dependence: Secondary | ICD-10-CM | POA: Insufficient documentation

## 2018-02-25 DIAGNOSIS — Z955 Presence of coronary angioplasty implant and graft: Secondary | ICD-10-CM | POA: Insufficient documentation

## 2018-02-25 HISTORY — DX: Atherosclerotic heart disease of native coronary artery without angina pectoris: I25.10

## 2018-02-25 HISTORY — DX: Acute myocardial infarction, unspecified: I21.9

## 2018-02-25 HISTORY — PX: DIRECT LARYNGOSCOPY: SHX5326

## 2018-02-25 HISTORY — DX: Tremor, unspecified: R25.1

## 2018-02-25 LAB — CBC
HCT: 43.9 % (ref 39.0–52.0)
Hemoglobin: 15.3 g/dL (ref 13.0–17.0)
MCH: 35.3 pg — ABNORMAL HIGH (ref 26.0–34.0)
MCHC: 34.9 g/dL (ref 30.0–36.0)
MCV: 101.2 fL — ABNORMAL HIGH (ref 80.0–100.0)
Platelets: 246 10*3/uL (ref 150–400)
RBC: 4.34 MIL/uL (ref 4.22–5.81)
RDW: 12.2 % (ref 11.5–15.5)
WBC: 9.1 10*3/uL (ref 4.0–10.5)
nRBC: 0 % (ref 0.0–0.2)

## 2018-02-25 LAB — BASIC METABOLIC PANEL
Anion gap: 9 (ref 5–15)
BUN: 13 mg/dL (ref 8–23)
CO2: 22 mmol/L (ref 22–32)
Calcium: 8.7 mg/dL — ABNORMAL LOW (ref 8.9–10.3)
Chloride: 111 mmol/L (ref 98–111)
Creatinine, Ser: 1 mg/dL (ref 0.61–1.24)
GFR calc Af Amer: >60 mL/min (ref >60)
GFR calc non Af Amer: >60 mL/min (ref >60)
Glucose, Bld: 92 mg/dL (ref 70–99)
Potassium: 3.8 mmol/L (ref 3.5–5.1)
Sodium: 142 mmol/L (ref 135–145)

## 2018-02-25 SURGERY — LARYNGOSCOPY, DIRECT
Anesthesia: General | Site: Mouth

## 2018-02-25 MED ORDER — FENTANYL CITRATE (PF) 250 MCG/5ML IJ SOLN
INTRAMUSCULAR | Status: AC
  Filled 2018-02-25: qty 5

## 2018-02-25 MED ORDER — LIDOCAINE 2% (20 MG/ML) 5 ML SYRINGE
INTRAMUSCULAR | Status: DC | PRN
  Administered 2018-02-25: 80 mg via INTRAVENOUS

## 2018-02-25 MED ORDER — PROMETHAZINE HCL 25 MG/ML IJ SOLN: 6.2500 mg | INTRAMUSCULAR | Status: DC | PRN

## 2018-02-25 MED ORDER — DEXAMETHASONE SODIUM PHOSPHATE 10 MG/ML IJ SOLN
INTRAMUSCULAR | Status: DC | PRN
  Administered 2018-02-25: 10 mg via INTRAVENOUS

## 2018-02-25 MED ORDER — EPINEPHRINE HCL (NASAL) 0.1 % NA SOLN
NASAL | Status: AC
  Filled 2018-02-25: qty 30

## 2018-02-25 MED ORDER — ACETAMINOPHEN 500 MG PO TABS
1000.0000 mg | ORAL_TABLET | Freq: Once | ORAL | Status: AC
  Administered 2018-02-25: 1000 mg via ORAL
  Filled 2018-02-25: qty 2

## 2018-02-25 MED ORDER — FENTANYL CITRATE (PF) 100 MCG/2ML IJ SOLN
INTRAMUSCULAR | Status: DC | PRN
  Administered 2018-02-25: 100 ug via INTRAVENOUS
  Administered 2018-02-25: 50 ug via INTRAVENOUS

## 2018-02-25 MED ORDER — DEXAMETHASONE SODIUM PHOSPHATE 10 MG/ML IJ SOLN
INTRAMUSCULAR | Status: AC
  Filled 2018-02-25: qty 1

## 2018-02-25 MED ORDER — ROCURONIUM BROMIDE 10 MG/ML (PF) SYRINGE
PREFILLED_SYRINGE | INTRAVENOUS | Status: DC | PRN
  Administered 2018-02-25: 50 mg via INTRAVENOUS

## 2018-02-25 MED ORDER — ONDANSETRON HCL 4 MG/2ML IJ SOLN
INTRAMUSCULAR | Status: AC
  Filled 2018-02-25: qty 2

## 2018-02-25 MED ORDER — ROCURONIUM BROMIDE 50 MG/5ML IV SOSY
PREFILLED_SYRINGE | INTRAVENOUS | Status: AC
  Filled 2018-02-25: qty 5

## 2018-02-25 MED ORDER — ONDANSETRON HCL 4 MG/2ML IJ SOLN
INTRAMUSCULAR | Status: DC | PRN
  Administered 2018-02-25: 4 mg via INTRAVENOUS

## 2018-02-25 MED ORDER — SUGAMMADEX SODIUM 200 MG/2ML IV SOLN
INTRAVENOUS | Status: DC | PRN
  Administered 2018-02-25: 200 mg via INTRAVENOUS
  Administered 2018-02-25: 125 mg via INTRAVENOUS

## 2018-02-25 MED ORDER — OXYMETAZOLINE HCL 0.05 % NA SOLN
NASAL | Status: DC | PRN
  Administered 2018-02-25: 1

## 2018-02-25 MED ORDER — SUCCINYLCHOLINE CHLORIDE 20 MG/ML IJ SOLN
INTRAMUSCULAR | Status: DC | PRN
  Administered 2018-02-25: 120 mg via INTRAVENOUS

## 2018-02-25 MED ORDER — LIDOCAINE 2% (20 MG/ML) 5 ML SYRINGE
INTRAMUSCULAR | Status: AC
  Filled 2018-02-25: qty 5

## 2018-02-25 MED ORDER — OXYMETAZOLINE HCL 0.05 % NA SOLN
NASAL | Status: AC
  Filled 2018-02-25: qty 15

## 2018-02-25 MED ORDER — SODIUM CHLORIDE 0.9 % IV SOLN
INTRAVENOUS | Status: DC | PRN
  Administered 2018-02-25: 30 ug/min via INTRAVENOUS

## 2018-02-25 MED ORDER — PROPOFOL 10 MG/ML IV BOLUS
INTRAVENOUS | Status: DC | PRN
  Administered 2018-02-25: 150 mg via INTRAVENOUS

## 2018-02-25 MED ORDER — LACTATED RINGERS IV SOLN
INTRAVENOUS | Status: DC
  Administered 2018-02-25 (×2): via INTRAVENOUS

## 2018-02-25 MED ORDER — PROPOFOL 10 MG/ML IV BOLUS
INTRAVENOUS | Status: AC
  Filled 2018-02-25: qty 20

## 2018-02-25 MED ORDER — EPINEPHRINE HCL (NASAL) 0.1 % NA SOLN
NASAL | Status: DC | PRN
  Administered 2018-02-25: 30 mL via NASAL

## 2018-02-25 MED ORDER — LACTATED RINGERS IV SOLN
INTRAVENOUS | Status: DC | PRN
  Administered 2018-02-25 (×2): via INTRAVENOUS

## 2018-02-25 MED ORDER — MIDAZOLAM HCL 2 MG/2ML IJ SOLN
INTRAMUSCULAR | Status: AC
  Filled 2018-02-25: qty 2

## 2018-02-25 MED ORDER — ESMOLOL HCL 100 MG/10ML IV SOLN
INTRAVENOUS | Status: AC
  Filled 2018-02-25: qty 10

## 2018-02-25 MED ORDER — MIDAZOLAM HCL 5 MG/5ML IJ SOLN
INTRAMUSCULAR | Status: DC | PRN
  Administered 2018-02-25: 1 mg via INTRAVENOUS

## 2018-02-25 MED ORDER — ESMOLOL HCL 100 MG/10ML IV SOLN
INTRAVENOUS | Status: DC | PRN
  Administered 2018-02-25: 20 mg via INTRAVENOUS
  Administered 2018-02-25: 30 mg via INTRAVENOUS

## 2018-02-25 MED ORDER — FENTANYL CITRATE (PF) 100 MCG/2ML IJ SOLN: 25.0000 ug | INTRAMUSCULAR | Status: DC | PRN

## 2018-02-25 SURGICAL SUPPLY — 24 items
BALLN PULM 15 16.5 18X75 (BALLOONS)
BALLOON PULM 15 16.5 18X75 (BALLOONS) IMPLANT
CANISTER SUCT 3000ML PPV (MISCELLANEOUS) ×2 IMPLANT
CONT SPEC 4OZ CLIKSEAL STRL BL (MISCELLANEOUS) IMPLANT
COVER BACK TABLE 60X90IN (DRAPES) ×2 IMPLANT
COVER MAYO STAND STRL (DRAPES) ×2 IMPLANT
COVER WAND RF STERILE (DRAPES) IMPLANT
CRADLE DONUT ADULT HEAD (MISCELLANEOUS) ×2 IMPLANT
DRAPE HALF SHEET 40X57 (DRAPES) ×2 IMPLANT
GAUZE 4X4 16PLY RFD (DISPOSABLE) ×2 IMPLANT
GLOVE BIO SURGEON STRL SZ7.5 (GLOVE) ×2 IMPLANT
GOWN STRL REUS W/ TWL LRG LVL3 (GOWN DISPOSABLE) ×1 IMPLANT
GOWN STRL REUS W/TWL LRG LVL3 (GOWN DISPOSABLE) ×1
GUARD TEETH (MISCELLANEOUS) ×2 IMPLANT
KIT TURNOVER KIT B (KITS) ×2 IMPLANT
NEEDLE HYPO 25GX1X1/2 BEV (NEEDLE) IMPLANT
NEEDLE TRANS ORAL INJECTION (NEEDLE) IMPLANT
NS IRRIG 1000ML POUR BTL (IV SOLUTION) ×2 IMPLANT
PAD ARMBOARD 7.5X6 YLW CONV (MISCELLANEOUS) ×4 IMPLANT
PATTIES SURGICAL .5 X3 (DISPOSABLE) ×2 IMPLANT
SOLUTION ANTI FOG 6CC (MISCELLANEOUS) IMPLANT
SURGILUBE 2OZ TUBE FLIPTOP (MISCELLANEOUS) IMPLANT
TOWEL OR 17X24 6PK STRL BLUE (TOWEL DISPOSABLE) ×4 IMPLANT
TUBE CONNECTING 12X1/4 (SUCTIONS) ×2 IMPLANT

## 2018-02-25 NOTE — Anesthesia Preprocedure Evaluation (Addendum)
Anesthesia Evaluation  Patient identified by MRN, date of birth, ID band Patient awake    Reviewed: Allergy & Precautions, NPO status , Patient's Chart, lab work & pertinent test results  History of Anesthesia Complications Negative for: history of anesthetic complications  Airway Mallampati: I  TM Distance: >3 FB Neck ROM: Full    Dental no notable dental hx. (+) Dental Advisory Given, Upper Dentures, Lower Dentures   Pulmonary former smoker,    Pulmonary exam normal        Cardiovascular hypertension, + CAD, + Past MI and + Cardiac Stents  Normal cardiovascular exam     Neuro/Psych negative neurological ROS  negative psych ROS   GI/Hepatic negative GI ROS, Neg liver ROS,   Endo/Other  negative endocrine ROS  Renal/GU negative Renal ROS     Musculoskeletal negative musculoskeletal ROS (+)   Abdominal   Peds  Hematology negative hematology ROS (+)   Anesthesia Other Findings Day of surgery medications reviewed with the patient.  Reproductive/Obstetrics                            Anesthesia Physical Anesthesia Plan  ASA: III  Anesthesia Plan: General   Post-op Pain Management:    Induction: Intravenous  PONV Risk Score and Plan: 3 and Ondansetron, Dexamethasone and Diphenhydramine  Airway Management Planned: Oral ETT  Additional Equipment:   Intra-op Plan:   Post-operative Plan: Extubation in OR  Informed Consent: I have reviewed the patients History and Physical, chart, labs and discussed the procedure including the risks, benefits and alternatives for the proposed anesthesia with the patient or authorized representative who has indicated his/her understanding and acceptance.     Dental advisory given  Plan Discussed with: CRNA and Anesthesiologist  Anesthesia Plan Comments:        Anesthesia Quick Evaluation

## 2018-02-25 NOTE — Anesthesia Postprocedure Evaluation (Signed)
Anesthesia Post Note  Patient: Naren Benally  Procedure(s) Performed: SUSPENDED DIRECT MICROLARYNGOSCOPY WITH CO2 LASER VOCAL CORD BIOPSY (N/A Mouth)     Patient location during evaluation: PACU Anesthesia Type: General Level of consciousness: sedated Pain management: pain level controlled Vital Signs Assessment: post-procedure vital signs reviewed and stable Respiratory status: spontaneous breathing and respiratory function stable Cardiovascular status: stable Postop Assessment: no apparent nausea or vomiting Anesthetic complications: no    Last Vitals:  Vitals:   02/25/18 1237 02/25/18 1250  BP: 120/63 135/65  Pulse: 63 61  Resp: 18 15  Temp:  36.5 C  SpO2: 92% 98%    Last Pain:  Vitals:   02/25/18 1250  TempSrc:   PainSc: 0-No pain                 Icarus Partch DANIEL

## 2018-02-25 NOTE — Anesthesia Procedure Notes (Signed)
Procedure Name: Intubation Performed by: Milford Cage, CRNA Pre-anesthesia Checklist: Patient identified, Emergency Drugs available, Suction available and Patient being monitored Patient Re-evaluated:Patient Re-evaluated prior to induction Oxygen Delivery Method: Circle System Utilized Preoxygenation: Pre-oxygenation with 100% oxygen Induction Type: IV induction Laryngoscope Size: Mac and 4 Grade View: Grade I Tube type: Reinforced Tube size: 6.0 mm Number of attempts: 1 Airway Equipment and Method: Stylet and Oral airway Placement Confirmation: ETT inserted through vocal cords under direct vision,  positive ETCO2 and breath sounds checked- equal and bilateral ETT to lip (cm): No marking on tube. Tube secured with: Tape Dental Injury: Teeth and Oropharynx as per pre-operative assessment

## 2018-02-25 NOTE — Op Note (Signed)
NAME: Ruben Conrad, Ruben Conrad MEDICAL RECORD [id number] ACCOUNT [id number] DATE OF BIRTH:Aug 04, 1940 FACILITY: [facility] LOCATION: [facility] PHYSICIAN:Amanee Iacovelli Guido Sander, MD  OPERATIVE REPORT  DATE OF PROCEDURE:  02/25/2018  PREOPERATIVE DIAGNOSES: 1.  Hoarseness. 2.  Vocal fold lesion.  POSTOPERATIVE DIAGNOSES:  1.  Hoarseness. 2.  Vocal fold lesion.  PROCEDURE:  Suspended microdirect laryngoscopy with CO2 laser biopsy of the vocal cord.  SURGEON:  Melida Quitter, MD  ANESTHESIA:  General endotracheal anesthesia.  COMPLICATIONS:  None.  INDICATIONS:  The patient is a 78 year old male whose voice became hoarse at Thanksgiving and has not improved.  In the office, he was found to have a mass between the vocal folds anteriorly and white color of the vocal folds.  He presents to the  operating room for surgical biopsy.  FINDINGS:  There was a white mass between the anterior commissure pedicle to the right anterior vocal fold that was debulked and sent as a biopsy.  A white patch just posterior to that was also biopsied separately as was a left mid cord white patch.  DESCRIPTION OF PROCEDURE:  The patient was identified in the holding room, informed consent having been obtained including discussion of risks, benefits and alternatives.  The patient was brought to the operative suite and put the operative table in  supine position.  Anesthesia was induced and the patient was intubated by the anesthesia team with a laser safe tube without difficulty.  The patient was given intravenous steroids during the case.  The eyes were taped closed and the bed was turned 90  degrees from anesthesia.  A damp gauze was placed over the upper gum and a Stortz laryngoscope was then placed into the supraglottic position and suspended to the Mayo stand using the Lewy arm.  The 0 degree telescope was then used to make a preoperative  photograph.  The operating microscope was then brought into view and an upbiting cup  forceps was used to take biopsies from the left mid cord and right mid cord and sent separately for pathology.  The anterior right mass was then grasped and the CO2  laser used to start an incision the posterior extent of the mass.  Ultimately, the mass was debulked in a piecemeal fashion using a cup forceps.  An epinephrine-soaked pledget was held against the site afterwards to control bleeding.  A postoperative  photograph was made with a 0 degree telescope.  The area was suctioned and the laryngoscope was taken out of suspension and removed from the patient's mouth.    He was turned back to Anesthesia for wakeup was extubated in the recovery room in stable condition.  Of note, during laser use.    The eyes were covered with damp eye pads and the face was covered with damp towel.  AN/NUANCE  D:02/25/2018 T:02/25/2018 JOB:[id number]

## 2018-02-25 NOTE — H&P (Signed)
Ruben Conrad is an 78 y.o. male.   Chief Complaint: Hoarseness, vocal fold lesion HPI: 78 year old male with hoarseness since Thanksgiving found to have a white mass between the vocal folds at the anterior commissure with white appearance to the anterior folds.  He presents for surgical biopsy.  Past Medical History:  Diagnosis Date  . Coronary artery disease    with stents  . Hypercholesteremia   . Hypertension   . Myocardial infarction ([place])   . Tremors of nervous system     Past Surgical History:  Procedure Laterality Date  . CORONARY ANGIOPLASTY WITH STENT PLACEMENT    . HIP FRACTURE SURGERY      Family History  Problem Relation Age of Onset  . Cancer Father        prostate   Social History:  reports that he quit smoking about 6 years ago. He has never used smokeless tobacco. He reports that he does not drink alcohol or use drugs.  Allergies:  Allergies  Allergen Reactions  . Primidone Hives    Medications Prior to Admission  Medication Sig Dispense Refill  . aspirin 325 MG tablet Take 325 mg by mouth daily.    Marland Kitchen atenolol (TENORMIN) 50 MG tablet Take 50 mg by mouth daily.   3  . atorvastatin (LIPITOR) 20 MG tablet Take 20 mg by mouth daily.   3  . Calcium Carbonate-Vit D-Min (CALTRATE 600+D PLUS MINERALS) 600-800 MG-UNIT CHEW Chew 1 each by mouth daily.     . Calcium-Phosphorus-Vitamin D (CALCIUM/D3 ADULT GUMMIES PO) Take 2 each by mouth daily.    . carbidopa-levodopa (SINEMET IR) 25-100 MG tablet Take 1 tablet by mouth 3 (three) times daily before meals. (Patient taking differently: Take 1 tablet by mouth 4 (four) times daily. ) 90 tablet 12  . clopidogrel (PLAVIX) 75 MG tablet Take 75 mg by mouth daily.   3  . docusate sodium (COLACE) 100 MG capsule Take 200 mg by mouth daily.     . Multiple Vitamins-Minerals (MULTIVITAMIN WITH MINERALS) tablet Take 1 tablet by mouth daily.      Results for orders placed or performed during the hospital encounter of 02/25/18  (from the past 48 hour(s))  Basic metabolic panel     Status: Abnormal   Collection Time: 02/25/18  9:04 AM  Result Value Ref Range   Sodium 142 135 - 145 mmol/L   Potassium 3.8 3.5 - 5.1 mmol/L   Chloride 111 98 - 111 mmol/L   CO2 22 22 - 32 mmol/L   Glucose, Bld 92 70 - 99 mg/dL   BUN 13 8 - 23 mg/dL   Creatinine, Ser 1.00 0.61 - 1.24 mg/dL   Calcium 8.7 (L) 8.9 - 10.3 mg/dL   GFR calc non Af Amer >60 >60 mL/min   GFR calc Af Amer >60 >60 mL/min   Anion gap 9 5 - 15    Comment: Performed at [facility], [place] [address] [place], [place] [address]  CBC     Status: Abnormal   Collection Time: 02/25/18  9:04 AM  Result Value Ref Range   WBC 9.1 4.0 - 10.5 K/uL   RBC 4.34 4.22 - 5.81 MIL/uL   Hemoglobin 15.3 13.0 - 17.0 g/dL   HCT 43.9 39.0 - 52.0 %   MCV 101.2 (H) 80.0 - 100.0 fL   MCH 35.3 (H) 26.0 - 34.0 pg   MCHC 34.9 30.0 - 36.0 g/dL   RDW 12.2 11.5 - 15.5 %  Platelets 246 150 - 400 K/uL   nRBC 0.0 0.0 - 0.2 %    Comment: Performed at [facility], [place] [address] [place], [place] [address]   No results found.  Review of Systems  All other systems reviewed and are negative.   Blood pressure (!) 146/75, pulse 60, temperature (!) 97.4 F (36.3 C), temperature source Oral, resp. rate 18, height 5\' 11"  (1.803 m), weight 81.6 kg, SpO2 98 %. Physical Exam  Constitutional: He is oriented to person, place, and time. He appears well-developed and well-nourished. No distress.  HENT:  Head: Normocephalic and atraumatic.  Right Ear: External ear normal.  Left Ear: External ear normal.  Nose: Nose normal.  Mouth/Throat: Oropharynx is clear and moist.  Moderate scratchy hoarseness.  Eyes: Pupils are equal, round, and reactive to light. Conjunctivae and EOM are normal.  Neck: Normal range of motion. Neck supple.  Cardiovascular: Normal rate.  Respiratory: Effort normal.  Neurological: He is alert and oriented to person, place, and time. No cranial nerve deficit.   Skin: Skin is warm and dry.  Psychiatric: He has a normal mood and affect. His behavior is normal. Judgment and thought content normal.     Assessment/Plan Hoarseness, glottic lesion  To OR for SMDL with biopsy.  Melida Quitter, MD 02/25/2018, 10:43 AM

## 2018-02-25 NOTE — Transfer of Care (Signed)
Immediate Anesthesia Transfer of Care Note  Patient: Ruben Conrad  Procedure(s) Performed: SUSPENDED DIRECT MICROLARYNGOSCOPY WITH CO2 LASER VOCAL CORD BIOPSY (N/A Mouth)  Patient Location: PACU  Anesthesia Type:General  Level of Consciousness: drowsy and patient cooperative  Airway & Oxygen Therapy: Patient Spontanous Breathing and Patient connected to face mask oxygen  Post-op Assessment: Report given to RN and Post -op Vital signs reviewed and stable  Post vital signs: Reviewed and stable  Last Vitals:  Vitals Value Taken Time  BP 138/71 02/25/2018 12:07 PM  Temp 36.5 C 02/25/2018 12:10 PM  Pulse 62 02/25/2018 12:12 PM  Resp 11 02/25/2018 12:12 PM  SpO2 100 % 02/25/2018 12:12 PM  Vitals shown include unvalidated device data.  Last Pain:  Vitals:   02/25/18 1210  TempSrc:   PainSc: 0-No pain      Patients Stated Pain Goal: 5 (17/71/16 5790)  Complications: No apparent anesthesia complications

## 2018-02-25 NOTE — Brief Op Note (Signed)
02/25/2018  11:53 AM  PATIENT:  Ruben Conrad  78 y.o. male  PRE-OPERATIVE DIAGNOSIS:  Hoarseness, vocal fold lesion  POST-OPERATIVE DIAGNOSIS:  Hoarseness, vocal fold lesion  PROCEDURE:  Procedure(s): SUSPENDED DIRECT MICROLARYNGOSCOPY WITH CO2 LASER VOCAL CORD BIOPSY (N/A)  SURGEON:  Surgeon(s) and Role:    Melida Quitter, MD - Primary  PHYSICIAN ASSISTANT:   ASSISTANTS: none   ANESTHESIA:   general  EBL:  Minimal  BLOOD ADMINISTERED:none  DRAINS: none   LOCAL MEDICATIONS USED:  NONE  SPECIMEN:  Source of Specimen:  Left mid cord, right mid cord, and right anterior cord mass  DISPOSITION OF SPECIMEN:  PATHOLOGY  COUNTS:  YES  TOURNIQUET:  * No tourniquets in log *  DICTATION: .Other Dictation: Dictation Number [id number]  PLAN OF CARE: Discharge to home after PACU  PATIENT DISPOSITION:  PACU - hemodynamically stable.   Delay start of Pharmacological VTE agent (>24hrs) due to surgical blood loss or risk of bleeding: no

## 2018-02-26 ENCOUNTER — Encounter (HOSPITAL_COMMUNITY): Payer: Self-pay | Admitting: Otolaryngology

## 2018-03-11 ENCOUNTER — Telehealth: Payer: Self-pay | Admitting: *Deleted

## 2018-03-12 DIAGNOSIS — C320 Malignant neoplasm of glottis: Secondary | ICD-10-CM | POA: Diagnosis not present

## 2018-03-20 NOTE — Progress Notes (Signed)
Head and Neck Cancer Location of Tumor / Histology:  02/25/18 Diagnosis 1. Vocal cord, biopsy, Left Mid - DYSPLASTIC SQUAMOUS EPITHELIUM. SEE COMMENT. 2. Vocal cord, biopsy, Right Mid - DYSPLASTIC SQUAMOUS EPITHELIUM, SEE COMMENT. 3. Vocal cord, biopsy, Right Anterior - INVASIVE SQUAMOUS CELL CARCINOMA, WELL TO MODERATELY DIFFERENTIATED. SEE NOTE.  Patient presented months ago with symptoms of: He reported hoarseness since Thanksgiving to Dr. Redmond Baseman on 02/07/18 at his initial consult.   Biopsies of vocal cord revealed: invasive squamous cell carcinoma.   Nutrition Status Yes No Comments  Weight changes? []  [x]    Swallowing concerns? []  [x]    PEG? []  [x]     Referrals Yes No Comments  Social Work? []  [x]    Dentistry? []  [x]    Swallowing therapy? []  [x]    Nutrition? []  [x]    Med/Onc? []  [x]     Safety Issues Yes No Comments  Prior radiation? []  [x]    Pacemaker/ICD? []  [x]    Possible current pregnancy? []  [x]    Is the patient on methotrexate? []  [x]     Tobacco/Marijuana/Snuff/ETOH use: He is a former smoker, quitting in 2013. He does not drink alcohol.   Past/Anticipated interventions by otolaryngology, if any:  02/25/18 PROCEDURE:  Suspended microdirect laryngoscopy with CO2 laser biopsy of the vocal cord. SURGEON:  Melida Quitter, MD  Past/Anticipated interventions by medical oncology, if any:  Not scheduled.   Current Complaints / other details:    BP 109/77 (BP Location: Left Arm, Patient Position: Sitting)   Pulse (!) 58   Temp 98.1 F (36.7 C) (Oral)   Resp 18   Ht 5\' 9"  (1.753 m)   Wt 187 lb 8 oz (85 kg)   SpO2 95%   BMI 27.69 kg/m    Wt Readings from Last 3 Encounters:  03/26/18 187 lb 8 oz (85 kg)  02/25/18 180 lb (81.6 kg)  12/26/17 180 lb (81.6 kg)

## 2018-03-25 ENCOUNTER — Telehealth: Payer: Self-pay | Admitting: *Deleted

## 2018-03-25 NOTE — Telephone Encounter (Signed)
Oncology Nurse Navigator Documentation  Rec'd call back VMM from pt confirming arrival for tomorrow's 8:30 NE and 9:00 Consult with Dr. Isidore Moos.  Gayleen Orem, RN, BSN Head & Neck Oncology Nurse [place] at [place] [phone]

## 2018-03-25 NOTE — Telephone Encounter (Signed)
Oncology Nurse Navigator Documentation Late Entry  Placed introductory call to new referral patient Mr. Feltman.  Introduced myself as the H&N oncology nurse navigator that works with Dr. Isidore Moos to whom he has been referred by Dr. Redmond Baseman.  Confirmed understanding of referral and appt date/time of 3/3 8:30 NE/9:00 Consult.  Briefly explained my role as his navigator, indicated I would be joining him during appt.  Confirmed understanding of [facility] location, explained arrival and RadOnc registration process.  Provided my contact information, encouraged him/her to call with questions/concerns before next week.  He/she verbalized understanding of information provided, expressed appreciation for my call.  Navigator Needs Assessment  Employment status:  Retired Futures trader . Support system:  Lives alone, dtr Kennyth Lose lives close . Transportation:  self . PCP:  Natividad Brood, [facility].  Sees him twice yearly.  Marland Kitchen PCD:  No.  Edentulous.  Gayleen Orem, RN, BSN Head & Neck Oncology Nurse [place] at [place] [phone]

## 2018-03-26 ENCOUNTER — Encounter: Payer: Self-pay | Admitting: *Deleted

## 2018-03-26 ENCOUNTER — Ambulatory Visit
Admission: RE | Admit: 2018-03-26 | Discharge: 2018-03-26 | Disposition: A | Discharge status: Home / Self Care | Payer: Medicare HMO | Source: Ambulatory Visit | Attending: Radiation Oncology | Admitting: Radiation Oncology

## 2018-03-26 ENCOUNTER — Encounter: Payer: Self-pay | Admitting: Radiation Oncology

## 2018-03-26 ENCOUNTER — Other Ambulatory Visit: Payer: Self-pay

## 2018-03-26 VITALS — BP 109/77 | HR 58 | Temp 98.1°F | Resp 18 | Ht 69.0 in | Wt 187.5 lb

## 2018-03-26 DIAGNOSIS — C119 Malignant neoplasm of nasopharynx, unspecified: Secondary | ICD-10-CM | POA: Diagnosis not present

## 2018-03-26 DIAGNOSIS — I2510 Atherosclerotic heart disease of native coronary artery without angina pectoris: Secondary | ICD-10-CM | POA: Insufficient documentation

## 2018-03-26 DIAGNOSIS — Z79899 Other long term (current) drug therapy: Secondary | ICD-10-CM | POA: Diagnosis not present

## 2018-03-26 DIAGNOSIS — Z87891 Personal history of nicotine dependence: Secondary | ICD-10-CM | POA: Diagnosis not present

## 2018-03-26 DIAGNOSIS — I10 Essential (primary) hypertension: Secondary | ICD-10-CM | POA: Insufficient documentation

## 2018-03-26 DIAGNOSIS — C320 Malignant neoplasm of glottis: Secondary | ICD-10-CM | POA: Diagnosis not present

## 2018-03-26 MED ORDER — LARYNGOSCOPY SOLUTION RAD-ONC
15.0000 mL | Freq: Once | TOPICAL | Status: AC
  Administered 2018-03-26: 15 mL via TOPICAL
  Filled 2018-03-26: qty 15

## 2018-03-26 NOTE — Progress Notes (Signed)
Radiation Oncology         [phone] ________________________________  Initial outpatient Consultation  Name: Ruben Conrad MRN: [id number]  Date: 03/26/2018  DOB: [birth year]  TO:IZTIW, [name], MD  Melida Quitter, MD   REFERRING PHYSICIAN: Melida Quitter, MD  DIAGNOSIS:    ICD-10-CM   1. 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 male who presented with a hoarse voice since Thanksgiving 2019.  Subsequently, the patient saw Dr. Redmond Baseman on 02/07/2018 for his initial consult who recommended vocal cord biopsy.  Biopsy of vocal cord on 02/25/2018 revealed: Vocal cord, biopsy, Left Mid with dysplastic sqaumous epithelium. Vocal cord, biopsy, Right Mid with dysplastic squamous epithelium. Vocal cord, biopsy, Right Anterior with invasive squamous cell carcinoma, well to moderately differentiated.   Swallowing issues, if any: N/a  Weight Changes: N/a  Pain status: N/a  Other symptoms: He denies weight loss, swallowing issues, SOB, lumps/bumps to neck, urinating, and any other symptoms.    Tobacco history, if any: He quit  smoking in 2013.   ETOH abuse, if any: He does not consume alcohol.   Prior cancers, if any: No  He is accompanied today by his two daughters. He denies hx of DM, MI, or CVA. He has had stent placement approximately 20 years ago.   PREVIOUS RADIATION THERAPY: No  PAST MEDICAL HISTORY:  has a past medical history of Coronary artery disease, Hypercholesteremia, Hypertension, Myocardial infarction ([place]), and Tremors of nervous system.    PAST SURGICAL HISTORY: Past Surgical History:  Procedure Laterality Date  . CORONARY ANGIOPLASTY WITH STENT PLACEMENT    . DIRECT LARYNGOSCOPY N/A 02/25/2018   Procedure: SUSPENDED DIRECT MICROLARYNGOSCOPY WITH CO2 LASER VOCAL CORD BIOPSY;  Surgeon: Melida Quitter, MD;  Location: [facility];  Service: ENT;  Laterality: N/A;  . HIP FRACTURE SURGERY Left 2018    FAMILY HISTORY: family history includes Cancer in his father.  SOCIAL HISTORY:  reports that he quit smoking about 6 years ago. He has never used smokeless tobacco. He reports that he does not drink alcohol or use drugs.  ALLERGIES: Primidone  MEDICATIONS:  Current Outpatient Medications  Medication Sig Dispense Refill  . aspirin 325 MG tablet Take 325 mg by mouth daily.    Marland Kitchen atenolol (TENORMIN) 50 MG tablet Take 50 mg by mouth daily.   3  . atorvastatin (LIPITOR) 20 MG tablet Take 20 mg by mouth daily.   3  . Calcium Carbonate-Vit D-Min (CALTRATE 600+D PLUS MINERALS) 600-800 MG-UNIT  CHEW Chew 1 each by mouth daily.     . carbidopa-levodopa (SINEMET IR) 25-100 MG tablet Take 1 tablet by mouth 3 (three) times daily before meals. (Patient taking differently: Take 1 tablet by mouth 4 (four) times daily. ) 90 tablet 12  . clopidogrel (PLAVIX) 75 MG tablet Take 75 mg by mouth daily.   3  . docusate sodium (COLACE) 100 MG capsule Take 200 mg by mouth daily.     . Multiple Vitamins-Minerals (MULTIVITAMIN WITH MINERALS) tablet Take 1 tablet by mouth daily.     No current facility-administered medications  for this encounter.     REVIEW OF SYSTEMS:  A 10+ POINT REVIEW OF SYSTEMS WAS OBTAINED including neurology, dermatology, psychiatry, cardiac, respiratory, lymph, extremities, GI, GU, Musculoskeletal, constitutional, breasts, reproductive, HEENT.  All pertinent positives are noted in the HPI.  All others are negative.   PHYSICAL EXAM:  height is 5\' 9"  (1.753 m) and weight is 187 lb 8 oz (85 kg). His oral temperature is 98.1 F (36.7 C). His blood pressure is 109/77 and his pulse is 58 (abnormal). His respiration is 18 and oxygen saturation is 95%.   General: Alert and oriented, in no acute distress HEENT: Head is normocephalic. Extraocular movements are intact. Oropharynx is notable for oral cavity clear. Neck: Neck is notable for no palpable masses. Heart: Regular in rate and rhythm with no murmurs, rubs, or gallops. Chest: Clear to auscultation bilaterally, with no rhonchi, wheezes, or rales. Abdomen: Soft, nontender, nondistended, with no rigidity or guarding. Extremities: No cyanosis or edema. Lymphatics: see Neck Exam Skin: No concerning lesions. Musculoskeletal: symmetric strength and muscle tone throughout. Neurologic: Cranial nerves II through XII are grossly intact. No obvious focalities. Speech is fluent. Coordination is intact. Psychiatric: Judgment and insight are intact. Affect is appropriate.  PROCEDURE NOTE: After obtaining consent and anesthetizing the nasal cavity with topical lidocaine and phenylephrine, the flexible endoscope was introduced and passed through the nasal cavity. White nodularity over the anterior 2/3 of both true vocal cords. There is some white tissue at the anterior commissure that is less nodular as well. Vocal cords are symmetrically mobile. It appears that there is some tissue deficit to the cords related to recent biopsy. At the midline of the nasopharynx. there is a benign appearing erythematous mass with white spots that may be a papilloma. No other  lesions in the larynx or pharynx.    ECOG = 1  0 - Asymptomatic (Fully active, able to carry on all predisease activities without restriction)  1 - Symptomatic but completely ambulatory (Restricted in physically strenuous activity but ambulatory and able to carry out work of a light or sedentary nature. For example, light housework, office work)  2 - Symptomatic, <50% in bed during the day (Ambulatory and capable of all self care but unable to carry out any work activities. Up and about more than 50% of waking hours)  3 - Symptomatic, >50% in bed, but not bedbound (Capable of only limited self-care, confined to bed or chair 50% or more of waking hours)  4 - Bedbound (Completely disabled. Cannot carry on any self-care. Totally confined to bed or chair)  5 - Death   Eustace Pen MM, Creech RH, Tormey DC, et al. [phone]). "Toxicity and response criteria of the [facility] [employer]". [place] Oncol. 5 (6): 649-55   LABORATORY DATA:  Lab Results  Component Value Date   WBC 9.1 02/25/2018   HGB 15.3 02/25/2018   HCT 43.9 02/25/2018  MCV 101.2 (H) 02/25/2018   PLT 246 02/25/2018   CMP     Component Value Date/Time   NA 142 02/25/2018 0904   K 3.8 02/25/2018 0904   CL 111 02/25/2018 0904   CO2 22 02/25/2018 0904   GLUCOSE 92 02/25/2018 0904   BUN 13 02/25/2018 0904   CREATININE 1.00 02/25/2018 0904   CALCIUM 8.7 (L) 02/25/2018 0904   GFRNONAA >60 02/25/2018 0904   GFRAA >60 02/25/2018 0904      No results found for: TSH   RADIOGRAPHY: No results found.    IMPRESSION/PLAN:  This is a delightful patient with head and neck cancer. I recommend radiotherapy for this patient.  We discussed the potential risks, benefits, and side effects of radiotherapy. We talked in detail about acute and late effects. We discussed that some of the most bothersome acute effects may be mucositis, skin irritation, hair loss, dehydration, weight loss, swallowing challenges and fatigue. We  talked about late effects which include but are not necessarily limited to dysphagia, hypothyroidism, nerve injury, laryngeal necrosis. No guarantees of treatment were given. A consent form was signed and placed in the patient's medical record. The patient is enthusiastic about proceeding with treatment. I look forward to participating in the patient's care.    Simulation (treatment planning) will take place in approximately 1 week.   We also discussed that the treatment of head and neck cancer is a multidisciplinary process to maximize treatment outcomes and quality of life. For this reason the following referrals have been or will be made:  Nutritionist for nutrition support during and after treatment.  Speech language pathology for swallowing and/or speech therapy.  Social work for social support.   Physical therapy due to risk of lymphedema in neck and deconditioning.  Baseline labs including TSH.  We will order a CT Chest and CT soft tissue neck for further staging in light of this diagnosis and smoking history.  I sent a message to Dr Redmond Baseman with the photos from laryngoscopy to inquire about his thoughts regarding the nasopharyngeal lesion.  __________________________________________   Eppie Gibson, MD   This document serves as a record of services personally performed by Eppie Gibson, MD. It was created on her behalf by [facility], a trained medical scribe. The creation of this record is based on the scribe's personal observations and the provider's statements to them. This document has been checked and approved by the attending provider.

## 2018-03-26 NOTE — Progress Notes (Signed)
Oncology Nurse Navigator Documentation  Met with Mr. Ferrington during initial consult with Dr. Squire.  He was accompanied by dtrs Jackie and Kathy.   . Further introduced myself as his Navigator, explained my role as a member of the Care Team.   . Provided New Patient Information packet, discussed contents: o Contact information for physician(s), myself, other members of the Care Team. o Advance Directive information ([facility] blue pamphlet with LCSW contact info).  He stated does not have ADs, I provided [facility] AD booklet, encouraged them to contact LCSW Lauren to complete. o Fall Prevention Patient Safety Plan o Appointment Guideline o Financial Assistance Information sheet o [facility] map with highlight of [facility] o SLP information sheet o Symptom Management Clinic information . Provided introductory explanation of radiation treatment including SIM planning and purpose of Aquaplast head and shoulder mask, showed them example.   . They voiced understanding CT Neck and CT Chest to be ordered/completed before CT SIM scheduled. . Provided a tour of SIM and LINAC areas, explained treatment and arrival procedures. . I encouraged them to contact me with questions/concerns as treatments/procedures begin.  They verbalized understanding of information provided.    Rick Diehl, RN, BSN Head & Neck Oncology Nurse Navigator [facility] at [place] [phone]    

## 2018-03-29 ENCOUNTER — Telehealth: Payer: Self-pay | Admitting: Radiation Oncology

## 2018-03-29 NOTE — Telephone Encounter (Signed)
CALLED AND SCHEDULED CT NECK AND CHEST WITH CENTRAL SCHEDULING. APPT. SCHEDULED FOR 04/05/18 @ 8 AM. PATIENT IS AWARE NO FOOD OR DRINK 4 HOURS PRIOR. ALSO AWARE TO PICK UP BOTTLES ON [facility] 04/03/18.

## 2018-04-04 ENCOUNTER — Telehealth: Payer: Self-pay | Admitting: *Deleted

## 2018-04-04 NOTE — Telephone Encounter (Signed)
Oncology Nurse Navigator Documentation  Rec'd call from pt's dtr, provided clarification of guidelines for tomorrow morning's CTs.  She voiced understanding.  Gayleen Orem, RN, BSN Head & Neck Oncology Nurse [place] at [place] [phone]

## 2018-04-05 ENCOUNTER — Other Ambulatory Visit: Payer: Self-pay

## 2018-04-05 ENCOUNTER — Ambulatory Visit (HOSPITAL_COMMUNITY)
Admission: RE | Admit: 2018-04-05 | Discharge: 2018-04-05 | Disposition: A | Discharge status: Home / Self Care | Payer: Medicare HMO | Source: Ambulatory Visit | Attending: Radiation Oncology | Admitting: Radiation Oncology

## 2018-04-05 DIAGNOSIS — C320 Malignant neoplasm of glottis: Secondary | ICD-10-CM | POA: Insufficient documentation

## 2018-04-05 MED ORDER — SODIUM CHLORIDE (PF) 0.9 % IJ SOLN
INTRAMUSCULAR | Status: AC
  Filled 2018-04-05: qty 50

## 2018-04-05 MED ORDER — IOHEXOL 300 MG/ML  SOLN
75.0000 mL | Freq: Once | INTRAMUSCULAR | Status: AC | PRN
  Administered 2018-04-05: 75 mL via INTRAVENOUS

## 2018-04-08 ENCOUNTER — Ambulatory Visit
Admission: RE | Admit: 2018-04-08 | Discharge: 2018-04-08 | Disposition: A | Discharge status: Home / Self Care | Payer: Medicare HMO | Source: Ambulatory Visit | Attending: Radiation Oncology | Admitting: Radiation Oncology

## 2018-04-08 ENCOUNTER — Other Ambulatory Visit: Payer: Self-pay | Admitting: Radiation Oncology

## 2018-04-08 ENCOUNTER — Other Ambulatory Visit: Payer: Self-pay

## 2018-04-08 ENCOUNTER — Encounter: Payer: Self-pay | Admitting: Radiation Oncology

## 2018-04-08 DIAGNOSIS — C320 Malignant neoplasm of glottis: Secondary | ICD-10-CM | POA: Insufficient documentation

## 2018-04-08 DIAGNOSIS — Z510 Encounter for antineoplastic radiation therapy: Secondary | ICD-10-CM | POA: Insufficient documentation

## 2018-04-08 DIAGNOSIS — R918 Other nonspecific abnormal finding of lung field: Secondary | ICD-10-CM

## 2018-04-08 NOTE — Progress Notes (Signed)
  Radiation Oncology         [phone] ________________________________  Name: Ruben Conrad MRN: [id number]  Date: 04/08/2018  DOB: Jun 11, 1940  SIMULATION AND TREATMENT PLANNING NOTE  Outpatient    ICD-10-CM   1. Malignant neoplasm of glottis ([place]) C32.0     NARRATIVE:  The patient was brought to the [facility]. Identity was confirmed.  All relevant records and images related to the planned course of therapy were reviewed.  The patient freely provided informed written consent to proceed with treatment after reviewing the details related to the planned course of therapy. The consent form was witnessed and verified by the simulation staff.    Then, the patient was set-up in a stable reproducible supine position for radiation therapy.  Aquaplast head and should mask was custom fabricated for immobilization.  CT images were obtained without contrast.  Surface markings were placed.  The CT images were loaded into the planning software.    TREATMENT PLANNING NOTE: Treatment planning then occurred.  The radiation prescription was entered and confirmed.    A total of 3 medically necessary complex treatment devices were fabricated and supervised by me (2 wedges for the opposed fields and the Aquaplast head and shoulder mask). I have requested : 3D Simulation  I have requested a DVH of the following structures: target volume, esophagus, spinal cord.   The patient will receive 63 Gy in 28 fractions to the larynx.  -----------------------------------  Eppie Gibson, MD

## 2018-04-09 DIAGNOSIS — Z510 Encounter for antineoplastic radiation therapy: Secondary | ICD-10-CM | POA: Diagnosis not present

## 2018-04-09 DIAGNOSIS — C320 Malignant neoplasm of glottis: Secondary | ICD-10-CM | POA: Diagnosis not present

## 2018-04-15 ENCOUNTER — Ambulatory Visit
Admission: RE | Admit: 2018-04-15 | Discharge: 2018-04-15 | Disposition: A | Discharge status: Home / Self Care | Payer: Medicare HMO | Source: Ambulatory Visit | Attending: Radiation Oncology | Admitting: Radiation Oncology

## 2018-04-15 ENCOUNTER — Other Ambulatory Visit: Payer: Self-pay

## 2018-04-15 DIAGNOSIS — C320 Malignant neoplasm of glottis: Secondary | ICD-10-CM

## 2018-04-15 DIAGNOSIS — Z510 Encounter for antineoplastic radiation therapy: Secondary | ICD-10-CM | POA: Diagnosis not present

## 2018-04-15 MED ORDER — SONAFINE EX EMUL
1.0000 "application " | Freq: Once | CUTANEOUS | Status: AC
  Administered 2018-04-15: 1 via TOPICAL

## 2018-04-15 NOTE — Progress Notes (Signed)

## 2018-04-16 ENCOUNTER — Other Ambulatory Visit: Payer: Self-pay

## 2018-04-16 ENCOUNTER — Ambulatory Visit: Payer: Medicare HMO | Attending: Family Medicine

## 2018-04-16 ENCOUNTER — Ambulatory Visit
Admission: RE | Admit: 2018-04-16 | Discharge: 2018-04-16 | Disposition: A | Discharge status: Home / Self Care | Payer: Medicare HMO | Source: Ambulatory Visit | Attending: Radiation Oncology | Admitting: Radiation Oncology

## 2018-04-16 ENCOUNTER — Encounter: Payer: Self-pay | Admitting: *Deleted

## 2018-04-16 ENCOUNTER — Inpatient Hospital Stay: Payer: Medicare HMO | Attending: Radiation Oncology | Admitting: Nutrition

## 2018-04-16 DIAGNOSIS — C320 Malignant neoplasm of glottis: Secondary | ICD-10-CM | POA: Diagnosis not present

## 2018-04-16 DIAGNOSIS — Z510 Encounter for antineoplastic radiation therapy: Secondary | ICD-10-CM | POA: Diagnosis not present

## 2018-04-16 DIAGNOSIS — R1310 Dysphagia, unspecified: Secondary | ICD-10-CM | POA: Insufficient documentation

## 2018-04-16 NOTE — Progress Notes (Signed)
RD working remotely.  78 year old male diagnosed with larynx cancer receiving 63 gy in 20 fxns with final radiation treatment scheduled for April 29.  PMH includes MI, HTN, CAD, Tobacco, and HLD.  Medications include Lipitor, Calcium carbonate, Vit D, MVI, and Colace.  Labs were reviewed.  Height: 5'9" Weight: 187.5 lb. UBW: 185 BMI: 27.69  Patient denies nutrition impact symtoms at the start of treatment. He is at his usual weight. Denies questions or concerns at this time.  Nutrition Diagnosis: Predicted suboptimal energy intake related to larynx cancer and associated treatments as evidenced by a condition for which research shows a decreased ability to consume adequate nutrition.  Intervention: Educated patient on importance of weight maintenance. Recommended 5-6 small meals/snacks consisting of high calorie, high protein foods. Patient is agreeable to weekly follow up. Encouraged him to contact me as needed.  Monitoring, Evaluation, Goals: Patient will tolerate adequate calories and protein to maintain weight and complete treatment.  Next Visit: Wednesday, April 1.

## 2018-04-17 ENCOUNTER — Other Ambulatory Visit: Payer: Self-pay

## 2018-04-17 ENCOUNTER — Ambulatory Visit
Admission: RE | Admit: 2018-04-17 | Discharge: 2018-04-17 | Disposition: A | Discharge status: Home / Self Care | Payer: Medicare HMO | Source: Ambulatory Visit | Attending: Radiation Oncology | Admitting: Radiation Oncology

## 2018-04-17 DIAGNOSIS — C320 Malignant neoplasm of glottis: Secondary | ICD-10-CM | POA: Diagnosis not present

## 2018-04-17 DIAGNOSIS — Z510 Encounter for antineoplastic radiation therapy: Secondary | ICD-10-CM | POA: Diagnosis not present

## 2018-04-17 NOTE — Therapy (Signed)
[facility] [address] [place], [place], [address] Phone: [phone]   Fax:  [phone]  Speech Language Pathology Evaluation  Patient Details  Name: Ruben Conrad MRN: [id number] Date of Birth: 1940-12-15 Referring Provider (SLP): Eppie Gibson, MD   Encounter Date: 04/16/2018  End of Session - 04/17/18 1146    Visit Number  1    Number of Visits  4    Date for SLP Re-Evaluation  07/15/18   90 days?   SLP Start Time  1100    SLP Stop Time   1135    SLP Time Calculation (min)  35 min    Activity Tolerance  Patient tolerated treatment well       Past Medical History:  Diagnosis Date  . Coronary artery disease    with stents  . Hypercholesteremia   . Hypertension   . Myocardial infarction ([place])   . Tremors of nervous system     Past Surgical History:  Procedure Laterality Date  . CORONARY ANGIOPLASTY WITH STENT PLACEMENT    . DIRECT LARYNGOSCOPY N/A 02/25/2018   Procedure: SUSPENDED DIRECT MICROLARYNGOSCOPY WITH CO2 LASER VOCAL CORD BIOPSY;  Surgeon: Melida Quitter, MD;  Location: [facility];  Service: ENT;  Laterality: N/A;  . HIP FRACTURE SURGERY Left 2018    There were no vitals filed for this visit.  Subjective Assessment - 04/17/18 1129    Subjective  Pt does not report any difficulties with swallowing presently    Currently in Pain?  No/denies         SLP Evaluation [facility] - 04/17/18 1129      SLP Visit Information   SLP Received On  04/16/18    Referring Provider (SLP)  Eppie Gibson, MD    Onset Date  Thanksgiving Day 2019    Medical Diagnosis  Malignant Neoplasm of Glottis      Subjective   Patient/Family Stated Goal  maintain swallow function      General Information   HPI  Pt is 78 y.o male diagnosed in February 2020 with cancer in the glottis- anterior commisure. Pt began rad tx yesterday (04-15-18). Currently does not report any difficulty indicating dysphagia.  Plan is for 63Gy in 28  sessions.      Oral Motor/Sensory Function   Overall Oral Motor/Sensory Function  Other (comment)   unable to assess due to telephone     Motor Speech   Phonation  Hoarse   mild      Pt currently tolerates soft-regular diet with thin liquids.   Because data states the risk for dysphagia during and after radiation treatment is high due to undergoing radiation tx, SLP taught pt about the possibility of reduced/limited ability for PO intake during rad tx. SLP encouraged pt to continue swallowing POs as far into rad tx as possible, even ingesting POs and/or completing HEP shortly after administration of pain meds.   SLP educated pt re: changes to swallowing musculature after rad tx, and why adherence to dysphagia HEP provided today and PO consumption was necessary to inhibit muscular disuse atrophy and to reduce muscle fibrosis following rad tx. Pt demonstrated understanding of these things to SLP.    SLP then developed a HEP for pt and pt was instructed how to perform exercises involving lingual, vocal, and pharyngeal strengthening over the telephone. Pt was instructed to complete this program twice to three times a day, until 6 months after his last rad tx, then x2 a week after that.  SLP Education - 04/17/18 1141    Education Details  abbreviated HEP, do HEP and POs after pain meds, late effects head/neck radiation on swallowing    Person(s) Educated  Patient    Methods  Explanation;Verbal cues    Comprehension  Verbalized understanding;Verbal cues required       SLP Short Term Goals - 04/17/18 1227      SLP SHORT TERM GOAL #1   Title  pt will complete HEP with occasional min A     Time  1    Period  --   session   Status  New      SLP SHORT TERM GOAL #2   Title  pt will tell SLP why he is completing HEP     Time  1    Period  --   session   Status  New       SLP Long Term Goals - 04/17/18 1233      SLP LONG TERM GOAL #1   Title  pt will  tell SLP how a food journal can foster a quicker return to a more-normalized diet    Time  2    Period  --   vists (visit #3)   Status  New      SLP LONG TERM GOAL #2   Title  pt will tell SLP 3 overt s/s of aspiration PNA with modified independence    Time  2    Period  --   sessions   Status  New      SLP LONG TERM GOAL #3   Title  pt will complete HEP with modified independence    Time  2    Period  --   visits (#3)   Status  New       Plan - 04/17/18 1151    Clinical Impression Statement  Pt reports oropharyngeal swallowing essentially WNL, however the probability of swallowing difficulty increases dramatically with the initiation of radiation therapy. Pt will need to be followed by SLP for regular assessment of accurate HEP completion as well as for safety with POs both during and following treatment/s.    Speech Therapy Frequency  --   approx once every four weeks   Duration  --   3 visits (4 sessions)   Treatment/Interventions  Aspiration precaution training;Pharyngeal strengthening exercises;Diet toleration management by SLP;Trials of upgraded texture/liquids;Internal/external aids;Patient/family education;Compensatory strategies;SLP instruction and feedback    Potential to Achieve Goals  Good    SLP Home Exercise Plan  described by SLP over telephone    Consulted and Agree with Plan of Care  Patient       Patient will benefit from skilled therapeutic intervention in order to improve the following deficits and impairments:   Dysphagia, unspecified type    Problem List Patient Active Problem List   Diagnosis Date Noted  . Malignant neoplasm of glottis ([place]) 03/26/2018  . Essential tremor 12/26/2017    [facility] ,MS, CCC-SLP  04/17/2018, 12:36 PM  [facility] [address] [place], [place], [address] Phone: [phone]   Fax:  [phone]  Name: Ruben Conrad MRN: [id number] Date of Birth:  [birth year]

## 2018-04-17 NOTE — Patient Instructions (Signed)
SWALLOWING EXERCISES Do these until 6 months after your last day of radiation, then 2  times per week afterwards  1. Effortful Swallows - Press your tongue against the roof of your mouth for 3 seconds, then squeeze the muscles in your neck while you swallow your saliva or a sip of water - Repeat 20 times, 2-3 times a day, and use whenever you eat or drink  2. Siren Exercise - Start at a note as low as you can, go as high as you can, then back down as low as you can. - Repeat 20 times, 2-3 times a day  3. Breath Hold - Say "HUH!" loudly, then hold your breath for 3 seconds at your voice box - Repeat 20 times, 2-3 times a day  4. Chin pushback - Open your mouth  - Place your fist UNDER your chin near your neck, and push back with your fist for 5 seconds - Repeat 10 times, 2-3 times a day   The above were described to pt over the telephone.

## 2018-04-18 ENCOUNTER — Ambulatory Visit
Admission: RE | Admit: 2018-04-18 | Discharge: 2018-04-18 | Disposition: A | Discharge status: Home / Self Care | Payer: Medicare HMO | Source: Ambulatory Visit | Attending: Radiation Oncology | Admitting: Radiation Oncology

## 2018-04-18 ENCOUNTER — Other Ambulatory Visit: Payer: Self-pay

## 2018-04-18 DIAGNOSIS — Z510 Encounter for antineoplastic radiation therapy: Secondary | ICD-10-CM | POA: Diagnosis not present

## 2018-04-18 DIAGNOSIS — C320 Malignant neoplasm of glottis: Secondary | ICD-10-CM | POA: Diagnosis not present

## 2018-04-19 ENCOUNTER — Other Ambulatory Visit: Payer: Self-pay

## 2018-04-19 ENCOUNTER — Ambulatory Visit
Admission: RE | Admit: 2018-04-19 | Discharge: 2018-04-19 | Disposition: A | Discharge status: Home / Self Care | Payer: Medicare HMO | Source: Ambulatory Visit | Attending: Radiation Oncology | Admitting: Radiation Oncology

## 2018-04-19 DIAGNOSIS — Z510 Encounter for antineoplastic radiation therapy: Secondary | ICD-10-CM | POA: Diagnosis not present

## 2018-04-19 DIAGNOSIS — C320 Malignant neoplasm of glottis: Secondary | ICD-10-CM | POA: Diagnosis not present

## 2018-04-22 ENCOUNTER — Ambulatory Visit
Admission: RE | Admit: 2018-04-22 | Discharge: 2018-04-22 | Disposition: A | Discharge status: Home / Self Care | Payer: Medicare HMO | Source: Ambulatory Visit | Attending: Radiation Oncology | Admitting: Radiation Oncology

## 2018-04-22 ENCOUNTER — Other Ambulatory Visit: Payer: Self-pay

## 2018-04-22 DIAGNOSIS — C320 Malignant neoplasm of glottis: Secondary | ICD-10-CM | POA: Diagnosis not present

## 2018-04-22 DIAGNOSIS — Z510 Encounter for antineoplastic radiation therapy: Secondary | ICD-10-CM | POA: Diagnosis not present

## 2018-04-23 ENCOUNTER — Ambulatory Visit
Admission: RE | Admit: 2018-04-23 | Discharge: 2018-04-23 | Disposition: A | Discharge status: Home / Self Care | Payer: Medicare HMO | Source: Ambulatory Visit | Attending: Radiation Oncology | Admitting: Radiation Oncology

## 2018-04-23 ENCOUNTER — Other Ambulatory Visit: Payer: Self-pay

## 2018-04-23 DIAGNOSIS — Z510 Encounter for antineoplastic radiation therapy: Secondary | ICD-10-CM | POA: Diagnosis not present

## 2018-04-23 DIAGNOSIS — C320 Malignant neoplasm of glottis: Secondary | ICD-10-CM | POA: Diagnosis not present

## 2018-04-24 ENCOUNTER — Ambulatory Visit
Admission: RE | Admit: 2018-04-24 | Discharge: 2018-04-24 | Disposition: A | Discharge status: Home / Self Care | Payer: Medicare HMO | Source: Ambulatory Visit | Attending: Radiation Oncology | Admitting: Radiation Oncology

## 2018-04-24 ENCOUNTER — Other Ambulatory Visit: Payer: Self-pay

## 2018-04-24 ENCOUNTER — Inpatient Hospital Stay: Payer: Medicare HMO | Attending: Radiation Oncology | Admitting: Nutrition

## 2018-04-24 DIAGNOSIS — C320 Malignant neoplasm of glottis: Secondary | ICD-10-CM | POA: Diagnosis not present

## 2018-04-24 DIAGNOSIS — Z510 Encounter for antineoplastic radiation therapy: Secondary | ICD-10-CM | POA: Diagnosis not present

## 2018-04-24 NOTE — Progress Notes (Signed)
RD working remotely.  Nutrition follow-up completed with patient receiving radiation treatment for larynx cancer. Final radiation scheduled for April 29. Patient reports weight has increased and was documented as 189 pounds on Monday.  This is increased from 187.5 pounds March 3. Patient denies nutrition impact symptoms. Reports he is drinking 1 nutrition shake daily. He has no questions or concerns at this time.  Nutrition diagnosis: Predicted suboptimal energy intake continues.  Intervention: Provided support and encouragement for patient to continue strategies for adequate calorie and protein intake for weight maintenance. Patient is agreeable to weekly follow-up by telephone. He has my contact information in case he has questions.  Monitoring, evaluation, goals: Patient will continue to tolerate adequate calories and protein for weight maintenance.  Next visit: Tuesday, April 7 by telephone.  **Disclaimer: This note was dictated with voice recognition software. Similar sounding words can inadvertently be transcribed and this note may contain transcription errors which may not have been corrected upon publication of note.**

## 2018-04-25 ENCOUNTER — Ambulatory Visit
Admission: RE | Admit: 2018-04-25 | Discharge: 2018-04-25 | Disposition: A | Discharge status: Home / Self Care | Payer: Medicare HMO | Source: Ambulatory Visit | Attending: Radiation Oncology | Admitting: Radiation Oncology

## 2018-04-25 ENCOUNTER — Other Ambulatory Visit: Payer: Self-pay

## 2018-04-25 DIAGNOSIS — C320 Malignant neoplasm of glottis: Secondary | ICD-10-CM | POA: Diagnosis not present

## 2018-04-25 DIAGNOSIS — Z510 Encounter for antineoplastic radiation therapy: Secondary | ICD-10-CM | POA: Diagnosis not present

## 2018-04-26 ENCOUNTER — Other Ambulatory Visit: Payer: Self-pay

## 2018-04-26 ENCOUNTER — Ambulatory Visit
Admission: RE | Admit: 2018-04-26 | Discharge: 2018-04-26 | Disposition: A | Discharge status: Home / Self Care | Payer: Medicare HMO | Source: Ambulatory Visit | Attending: Radiation Oncology | Admitting: Radiation Oncology

## 2018-04-26 DIAGNOSIS — C320 Malignant neoplasm of glottis: Secondary | ICD-10-CM | POA: Diagnosis not present

## 2018-04-26 DIAGNOSIS — Z510 Encounter for antineoplastic radiation therapy: Secondary | ICD-10-CM | POA: Diagnosis not present

## 2018-04-29 ENCOUNTER — Ambulatory Visit
Admission: RE | Admit: 2018-04-29 | Discharge: 2018-04-29 | Disposition: A | Discharge status: Home / Self Care | Payer: Medicare HMO | Source: Ambulatory Visit | Attending: Radiation Oncology | Admitting: Radiation Oncology

## 2018-04-29 ENCOUNTER — Other Ambulatory Visit: Payer: Self-pay | Admitting: Radiation Oncology

## 2018-04-29 ENCOUNTER — Other Ambulatory Visit: Payer: Self-pay

## 2018-04-29 DIAGNOSIS — C320 Malignant neoplasm of glottis: Secondary | ICD-10-CM

## 2018-04-29 DIAGNOSIS — Z510 Encounter for antineoplastic radiation therapy: Secondary | ICD-10-CM | POA: Diagnosis not present

## 2018-04-29 MED ORDER — LIDOCAINE VISCOUS HCL 2 % MT SOLN: OROMUCOSAL | 5 refills | Status: DC

## 2018-04-30 ENCOUNTER — Ambulatory Visit
Admission: RE | Admit: 2018-04-30 | Discharge: 2018-04-30 | Disposition: A | Discharge status: Home / Self Care | Payer: Medicare HMO | Source: Ambulatory Visit | Attending: Radiation Oncology | Admitting: Radiation Oncology

## 2018-04-30 ENCOUNTER — Other Ambulatory Visit: Payer: Self-pay

## 2018-04-30 ENCOUNTER — Inpatient Hospital Stay: Payer: Medicare HMO | Admitting: Nutrition

## 2018-04-30 DIAGNOSIS — C320 Malignant neoplasm of glottis: Secondary | ICD-10-CM | POA: Diagnosis not present

## 2018-04-30 DIAGNOSIS — Z510 Encounter for antineoplastic radiation therapy: Secondary | ICD-10-CM | POA: Diagnosis not present

## 2018-04-30 NOTE — Progress Notes (Signed)
RD working remotely.  Nutrition follow up completed with patient receiving radiation therapy for larynx cancer. He reports he thinks he weighed 186.4 pounds yesterday on MD scale. (cannot see XRT weights in Epic) Denies problems eating and drinking. Continues one oral nutrition supplement daily. Denies needs.  Nutrition Diagnosis: Predicted suboptimal energy intake continues.  Intervention: Continue strategies for adequate calories and protein.  Monitoring, Evaluation, Goals: Weight maintenance.  Next Visit:Tueday, April 14.

## 2018-05-01 ENCOUNTER — Ambulatory Visit
Admission: RE | Admit: 2018-05-01 | Discharge: 2018-05-01 | Disposition: A | Discharge status: Home / Self Care | Payer: Medicare HMO | Source: Ambulatory Visit | Attending: Radiation Oncology | Admitting: Radiation Oncology

## 2018-05-01 ENCOUNTER — Other Ambulatory Visit: Payer: Self-pay

## 2018-05-01 DIAGNOSIS — C320 Malignant neoplasm of glottis: Secondary | ICD-10-CM | POA: Diagnosis not present

## 2018-05-01 DIAGNOSIS — Z510 Encounter for antineoplastic radiation therapy: Secondary | ICD-10-CM | POA: Diagnosis not present

## 2018-05-02 ENCOUNTER — Other Ambulatory Visit: Payer: Self-pay

## 2018-05-02 ENCOUNTER — Encounter: Payer: Self-pay | Admitting: Radiation Oncology

## 2018-05-02 ENCOUNTER — Ambulatory Visit
Admission: RE | Admit: 2018-05-02 | Discharge: 2018-05-02 | Disposition: A | Discharge status: Home / Self Care | Payer: Medicare HMO | Source: Ambulatory Visit | Attending: Radiation Oncology | Admitting: Radiation Oncology

## 2018-05-02 DIAGNOSIS — Z510 Encounter for antineoplastic radiation therapy: Secondary | ICD-10-CM | POA: Diagnosis not present

## 2018-05-02 DIAGNOSIS — C320 Malignant neoplasm of glottis: Secondary | ICD-10-CM | POA: Diagnosis not present

## 2018-05-02 NOTE — Progress Notes (Addendum)
Ruben Conrad is currently under radiation treatment for carcinoma of the glottis under Dr. Isidore Moos. Today following his 14/28 fractions as he was getting off the table, splintering of the fiberglass table caused a splinter in his right ring fingertip. He was brought to clinic and had two 3-4 mm shards of fiberglass in the fingertip. After verbal consent was given, the fingertip was cleansed with an alcohol pad, and without difficulty the first of two splinters was removed with a sterile tweezer. The second was further lodged somewhat lateral to the entrance site. After swabbing again with an alcohol pad, a 23 gauge needle was introduced to open the tract of the shard. This was then removed with the same tweezer. He washed his hands with warm soapy water, then a bandage was placed over the site. At the end of the encounter, no bleeding was noted. He was encouraged to let Dr. Isidore Moos, or the therapy staff know if he develops any redness at this site. He is in agreement with this plan.       Carola Rhine, PAC

## 2018-05-03 ENCOUNTER — Ambulatory Visit
Admission: RE | Admit: 2018-05-03 | Discharge: 2018-05-03 | Disposition: A | Discharge status: Home / Self Care | Payer: Medicare HMO | Source: Ambulatory Visit | Attending: Radiation Oncology | Admitting: Radiation Oncology

## 2018-05-03 ENCOUNTER — Other Ambulatory Visit: Payer: Self-pay

## 2018-05-03 DIAGNOSIS — Z510 Encounter for antineoplastic radiation therapy: Secondary | ICD-10-CM | POA: Diagnosis not present

## 2018-05-03 DIAGNOSIS — C320 Malignant neoplasm of glottis: Secondary | ICD-10-CM | POA: Diagnosis not present

## 2018-05-06 ENCOUNTER — Other Ambulatory Visit: Payer: Self-pay

## 2018-05-06 ENCOUNTER — Ambulatory Visit
Admission: RE | Admit: 2018-05-06 | Discharge: 2018-05-06 | Disposition: A | Discharge status: Home / Self Care | Payer: Medicare HMO | Source: Ambulatory Visit | Attending: Radiation Oncology | Admitting: Radiation Oncology

## 2018-05-06 DIAGNOSIS — Z510 Encounter for antineoplastic radiation therapy: Secondary | ICD-10-CM | POA: Diagnosis not present

## 2018-05-06 DIAGNOSIS — C320 Malignant neoplasm of glottis: Secondary | ICD-10-CM | POA: Diagnosis not present

## 2018-05-07 ENCOUNTER — Other Ambulatory Visit: Payer: Self-pay

## 2018-05-07 ENCOUNTER — Inpatient Hospital Stay: Payer: Medicare HMO | Admitting: Nutrition

## 2018-05-07 ENCOUNTER — Ambulatory Visit
Admission: RE | Admit: 2018-05-07 | Discharge: 2018-05-07 | Disposition: A | Discharge status: Home / Self Care | Payer: Medicare HMO | Source: Ambulatory Visit | Attending: Radiation Oncology | Admitting: Radiation Oncology

## 2018-05-07 DIAGNOSIS — Z510 Encounter for antineoplastic radiation therapy: Secondary | ICD-10-CM | POA: Diagnosis not present

## 2018-05-07 DIAGNOSIS — C320 Malignant neoplasm of glottis: Secondary | ICD-10-CM | POA: Diagnosis not present

## 2018-05-07 NOTE — Progress Notes (Signed)
RD working remotely.  Brief nutrition follow up completed with patient.  He reports his larynx is beginning to get sore.  He has not changed his oral intake. No recent weight documented.  Predicted suboptimal intake continues  Continue strategies for adequate calories and protein.  Goal is weight maintenance.  F/U scheduled Tuesday, April 21.

## 2018-05-08 ENCOUNTER — Other Ambulatory Visit: Payer: Self-pay

## 2018-05-08 ENCOUNTER — Ambulatory Visit
Admission: RE | Admit: 2018-05-08 | Discharge: 2018-05-08 | Disposition: A | Discharge status: Home / Self Care | Payer: Medicare HMO | Source: Ambulatory Visit | Attending: Radiation Oncology | Admitting: Radiation Oncology

## 2018-05-08 DIAGNOSIS — C320 Malignant neoplasm of glottis: Secondary | ICD-10-CM | POA: Diagnosis not present

## 2018-05-08 DIAGNOSIS — Z510 Encounter for antineoplastic radiation therapy: Secondary | ICD-10-CM | POA: Diagnosis not present

## 2018-05-09 ENCOUNTER — Other Ambulatory Visit: Payer: Self-pay

## 2018-05-09 ENCOUNTER — Ambulatory Visit
Admission: RE | Admit: 2018-05-09 | Discharge: 2018-05-09 | Disposition: A | Discharge status: Home / Self Care | Payer: Medicare HMO | Source: Ambulatory Visit | Attending: Radiation Oncology | Admitting: Radiation Oncology

## 2018-05-09 DIAGNOSIS — C320 Malignant neoplasm of glottis: Secondary | ICD-10-CM | POA: Diagnosis not present

## 2018-05-09 DIAGNOSIS — Z510 Encounter for antineoplastic radiation therapy: Secondary | ICD-10-CM | POA: Diagnosis not present

## 2018-05-10 ENCOUNTER — Other Ambulatory Visit: Payer: Self-pay

## 2018-05-10 ENCOUNTER — Ambulatory Visit
Admission: RE | Admit: 2018-05-10 | Discharge: 2018-05-10 | Disposition: A | Discharge status: Home / Self Care | Payer: Medicare HMO | Source: Ambulatory Visit | Attending: Radiation Oncology | Admitting: Radiation Oncology

## 2018-05-10 DIAGNOSIS — C320 Malignant neoplasm of glottis: Secondary | ICD-10-CM | POA: Diagnosis not present

## 2018-05-10 DIAGNOSIS — Z510 Encounter for antineoplastic radiation therapy: Secondary | ICD-10-CM | POA: Diagnosis not present

## 2018-05-10 MED ORDER — SONAFINE EX EMUL: 1.0000 "application " | Freq: Two times a day (BID) | CUTANEOUS | Status: DC

## 2018-05-13 ENCOUNTER — Other Ambulatory Visit: Payer: Self-pay | Admitting: Radiation Oncology

## 2018-05-13 ENCOUNTER — Other Ambulatory Visit: Payer: Self-pay

## 2018-05-13 ENCOUNTER — Ambulatory Visit
Admission: RE | Admit: 2018-05-13 | Discharge: 2018-05-13 | Disposition: A | Discharge status: Home / Self Care | Payer: Medicare HMO | Source: Ambulatory Visit | Attending: Radiation Oncology | Admitting: Radiation Oncology

## 2018-05-13 DIAGNOSIS — C320 Malignant neoplasm of glottis: Secondary | ICD-10-CM | POA: Diagnosis not present

## 2018-05-13 DIAGNOSIS — Z510 Encounter for antineoplastic radiation therapy: Secondary | ICD-10-CM | POA: Diagnosis not present

## 2018-05-14 ENCOUNTER — Inpatient Hospital Stay: Payer: Medicare HMO | Admitting: Nutrition

## 2018-05-14 ENCOUNTER — Ambulatory Visit
Admission: RE | Admit: 2018-05-14 | Discharge: 2018-05-14 | Disposition: A | Discharge status: Home / Self Care | Payer: Medicare HMO | Source: Ambulatory Visit | Attending: Radiation Oncology | Admitting: Radiation Oncology

## 2018-05-14 ENCOUNTER — Other Ambulatory Visit: Payer: Self-pay

## 2018-05-14 DIAGNOSIS — C320 Malignant neoplasm of glottis: Secondary | ICD-10-CM | POA: Diagnosis not present

## 2018-05-14 DIAGNOSIS — Z510 Encounter for antineoplastic radiation therapy: Secondary | ICD-10-CM | POA: Diagnosis not present

## 2018-05-14 NOTE — Progress Notes (Signed)
RD working remotely.  Completed f/u with patient who continues to report he is eating well and denies nutrition impact symptoms. He is scheduled to finish his radiation treatment next week. He feels comfortable with his knowledge and his ability to maintain adequate nutrition. He cancelled his final nutrition follow up but has my number for questions.

## 2018-05-15 ENCOUNTER — Other Ambulatory Visit: Payer: Self-pay

## 2018-05-15 ENCOUNTER — Ambulatory Visit
Admission: RE | Admit: 2018-05-15 | Discharge: 2018-05-15 | Disposition: A | Discharge status: Home / Self Care | Payer: Medicare HMO | Source: Ambulatory Visit | Attending: Radiation Oncology | Admitting: Radiation Oncology

## 2018-05-15 DIAGNOSIS — C320 Malignant neoplasm of glottis: Secondary | ICD-10-CM | POA: Diagnosis not present

## 2018-05-15 DIAGNOSIS — Z510 Encounter for antineoplastic radiation therapy: Secondary | ICD-10-CM | POA: Diagnosis not present

## 2018-05-16 ENCOUNTER — Ambulatory Visit
Admission: RE | Admit: 2018-05-16 | Discharge: 2018-05-16 | Disposition: A | Discharge status: Home / Self Care | Payer: Medicare HMO | Source: Ambulatory Visit | Attending: Radiation Oncology | Admitting: Radiation Oncology

## 2018-05-16 ENCOUNTER — Other Ambulatory Visit: Payer: Self-pay

## 2018-05-16 DIAGNOSIS — Z510 Encounter for antineoplastic radiation therapy: Secondary | ICD-10-CM | POA: Diagnosis not present

## 2018-05-16 DIAGNOSIS — C320 Malignant neoplasm of glottis: Secondary | ICD-10-CM | POA: Diagnosis not present

## 2018-05-17 ENCOUNTER — Other Ambulatory Visit: Payer: Self-pay

## 2018-05-17 ENCOUNTER — Ambulatory Visit
Admission: RE | Admit: 2018-05-17 | Discharge: 2018-05-17 | Disposition: A | Discharge status: Home / Self Care | Payer: Medicare HMO | Source: Ambulatory Visit | Attending: Radiation Oncology | Admitting: Radiation Oncology

## 2018-05-17 DIAGNOSIS — C320 Malignant neoplasm of glottis: Secondary | ICD-10-CM | POA: Diagnosis not present

## 2018-05-17 DIAGNOSIS — Z510 Encounter for antineoplastic radiation therapy: Secondary | ICD-10-CM | POA: Diagnosis not present

## 2018-05-20 ENCOUNTER — Ambulatory Visit
Admission: RE | Admit: 2018-05-20 | Discharge: 2018-05-20 | Disposition: A | Discharge status: Home / Self Care | Payer: Medicare HMO | Source: Ambulatory Visit | Attending: Radiation Oncology | Admitting: Radiation Oncology

## 2018-05-20 ENCOUNTER — Other Ambulatory Visit: Payer: Self-pay

## 2018-05-20 DIAGNOSIS — C320 Malignant neoplasm of glottis: Secondary | ICD-10-CM | POA: Diagnosis not present

## 2018-05-20 DIAGNOSIS — Z510 Encounter for antineoplastic radiation therapy: Secondary | ICD-10-CM | POA: Diagnosis not present

## 2018-05-20 MED ORDER — SILVER SULFADIAZINE 1 % EX CREA
TOPICAL_CREAM | Freq: Two times a day (BID) | CUTANEOUS | Status: DC
  Administered 2018-05-20: 16:00:00 via TOPICAL

## 2018-05-21 ENCOUNTER — Encounter: Payer: Medicare HMO | Admitting: Nutrition

## 2018-05-21 ENCOUNTER — Other Ambulatory Visit: Payer: Self-pay

## 2018-05-21 ENCOUNTER — Ambulatory Visit
Admission: RE | Admit: 2018-05-21 | Discharge: 2018-05-21 | Disposition: A | Discharge status: Home / Self Care | Payer: Medicare HMO | Source: Ambulatory Visit | Attending: Radiation Oncology | Admitting: Radiation Oncology

## 2018-05-21 DIAGNOSIS — Z510 Encounter for antineoplastic radiation therapy: Secondary | ICD-10-CM | POA: Diagnosis not present

## 2018-05-21 DIAGNOSIS — C320 Malignant neoplasm of glottis: Secondary | ICD-10-CM | POA: Diagnosis not present

## 2018-05-22 ENCOUNTER — Encounter: Payer: Self-pay | Admitting: Radiation Oncology

## 2018-05-22 ENCOUNTER — Ambulatory Visit
Admission: RE | Admit: 2018-05-22 | Discharge: 2018-05-22 | Disposition: A | Discharge status: Home / Self Care | Payer: Medicare HMO | Source: Ambulatory Visit | Attending: Radiation Oncology | Admitting: Radiation Oncology

## 2018-05-22 DIAGNOSIS — C320 Malignant neoplasm of glottis: Secondary | ICD-10-CM | POA: Diagnosis not present

## 2018-05-22 DIAGNOSIS — Z510 Encounter for antineoplastic radiation therapy: Secondary | ICD-10-CM | POA: Diagnosis not present

## 2018-06-12 ENCOUNTER — Telehealth: Payer: Self-pay | Admitting: Radiation Oncology

## 2018-06-12 ENCOUNTER — Encounter: Payer: Self-pay | Admitting: Radiation Oncology

## 2018-06-12 NOTE — Progress Notes (Signed)
I called the patient today about their upcoming follow-up appointment in radiation oncology.   Given concerns about the COVID-19 pandemic, I offered a phone assessment with the patient to determine if coming to the clinic was necessary. The patient accepted.  Symptomatically, the patient is doing relatively well. They report a little throat soreness. He is eating and drinking well. Skin is healed. Voice is not hoarse over the phone. He has no concerns.  All questions were answered to the patient's satisfaction.  I encouraged the patient to call with any further questions. Otherwise, the plan is f/u with me in August after a restaging chest CT to follow some nodules seen in March.  Gayleen Orem, RN, our Head and Neck Oncology Navigator will reach out to Dr Redmond Baseman about scheduling followup in his office as well, to be in about 2 mo, as we currently don't have the PPE for laryngoscopies.    Patient is pleased with this plan, and we will cancel their upcoming follow-up to reduce the risk of COVID-19 transmission.  -----------------------------------  Eppie Gibson, MD

## 2018-06-13 NOTE — Progress Notes (Signed)
  Patient Name: Ruben Conrad MRN: [id number] DOB: 1940/11/20 Referring Physician: Redmond Baseman DWIGHT (Profile Not Attached) Date of Service: 05/22/2018 [facility], [place]                                                        End Of Treatment Note  Diagnoses: C32.0-Malignant neoplasm of glottis  Cancer Staging Malignant neoplasm of glottis [facility]) Staging form: Larynx - Glottis, AJCC 8th Edition - Clinical stage from 03/26/2018: Stage I (cT1b, cN0, cM0) - Signed by Eppie Gibson, MD on 03/26/2018  Intent: Curative  Radiation Treatment Dates: 04/15/2018 through 05/22/2018 Site Technique Total Dose Dose per Fx Completed Fx Beam Energies  Head & neck: HN_larynx 3D 63/63 2.25 28/28 6X   Narrative: The patient tolerated radiation therapy relatively well. He did develop moderate pain and difficulty with swallowing. He also noted dry mouth and thick saliva. His skin was erythematous with impending desquamation in the treatment fields. He is applying Sonafine and Neosporin with some relief. He was also given Silvadene with instructions on how to use.   Plan: The patient will follow-up with radiation oncology in 2-3 weeks.  ________________________________________________  Eppie Gibson, MD  This document serves as a record of services personally performed by Eppie Gibson, MD. It was created on her behalf by Rae Lips, a trained medical scribe. The creation of this record is based on the scribe's personal observations and the provider's statements to them. This document has been checked and approved by the attending provider.

## 2018-06-14 ENCOUNTER — Ambulatory Visit: Payer: Medicare HMO | Admitting: Radiation Oncology

## 2018-06-14 ENCOUNTER — Telehealth: Payer: Self-pay | Admitting: *Deleted

## 2018-06-14 NOTE — Telephone Encounter (Signed)
Oncology Nurse Navigator Documentation  Per patient's 5/20 post-treatment follow-up with Dr. Isidore Moos, called [facility] to coordinate appointment with Dr. Redmond Baseman.  Spoke with Ardeth Sportsman, requested patient be contacted and scheduled for routine follow-up to include laryngoscopy in 2 months for laryngeal SCC treatment completed 05/22/2018.Marland Kitchen  She verbalized understanding.  Gayleen Orem, RN, BSN Head & Neck Oncology Nurse [place] at [place] [phone]

## 2018-07-09 ENCOUNTER — Telehealth: Payer: Self-pay | Admitting: *Deleted

## 2018-07-09 NOTE — Telephone Encounter (Signed)
LVM informing the patient that due to current COVID 19 pandemic, our office is severely reducing in person visits in order to minimize the risk to our patients and healthcare providers. We recommend to convert your appointment to a video visit. Otherwise he can reschedule for July or later to come into office for follow up. Left number.

## 2018-07-09 NOTE — Telephone Encounter (Signed)
Called daughter, on [place] and LVM advising her that patient's appt needs to be converted to video visit or rescheduled for July. This is due to current COVID 19 pandemic, our office is severely reducing in person visits in order to minimize the risk to our patients and healthcare providers.  I advised her that if she cannot call back by 4 pm today I will reschedule him for July. Left office number.

## 2018-07-10 ENCOUNTER — Ambulatory Visit: Payer: Medicare HMO | Admitting: Diagnostic Neuroimaging

## 2018-07-16 DIAGNOSIS — I2510 Atherosclerotic heart disease of native coronary artery without angina pectoris: Secondary | ICD-10-CM | POA: Diagnosis not present

## 2018-07-16 DIAGNOSIS — I10 Essential (primary) hypertension: Secondary | ICD-10-CM | POA: Diagnosis not present

## 2018-07-16 DIAGNOSIS — G250 Essential tremor: Secondary | ICD-10-CM | POA: Diagnosis not present

## 2018-07-16 DIAGNOSIS — Z1389 Encounter for screening for other disorder: Secondary | ICD-10-CM | POA: Diagnosis not present

## 2018-07-16 DIAGNOSIS — Z0000 Encounter for general adult medical examination without abnormal findings: Secondary | ICD-10-CM | POA: Diagnosis not present

## 2018-07-16 DIAGNOSIS — C320 Malignant neoplasm of glottis: Secondary | ICD-10-CM | POA: Diagnosis not present

## 2018-07-16 DIAGNOSIS — E782 Mixed hyperlipidemia: Secondary | ICD-10-CM | POA: Diagnosis not present

## 2018-07-16 DIAGNOSIS — M810 Age-related osteoporosis without current pathological fracture: Secondary | ICD-10-CM | POA: Diagnosis not present

## 2018-07-16 DIAGNOSIS — D692 Other nonthrombocytopenic purpura: Secondary | ICD-10-CM | POA: Diagnosis not present

## 2018-08-05 DIAGNOSIS — E782 Mixed hyperlipidemia: Secondary | ICD-10-CM | POA: Diagnosis not present

## 2018-08-05 DIAGNOSIS — I10 Essential (primary) hypertension: Secondary | ICD-10-CM | POA: Diagnosis not present

## 2018-08-12 NOTE — Progress Notes (Signed)
Oncology Nurse Navigator Documentation  In support of COVID-19 mitigation practices, Mr. Ruben Conrad participated in this morning's H&N [place] via telephone and received scheduled calls from Nutrition and SLP.  He will be referred to PT post-RT for lymphedema evaluation/treatment as needed.  Gayleen Orem, RN, BSN Head & Neck Oncology Nurse [place] at [place] [phone]

## 2018-08-19 DIAGNOSIS — Z923 Personal history of irradiation: Secondary | ICD-10-CM | POA: Diagnosis not present

## 2018-08-19 DIAGNOSIS — Z8521 Personal history of malignant neoplasm of larynx: Secondary | ICD-10-CM | POA: Diagnosis not present

## 2018-08-19 DIAGNOSIS — J384 Edema of larynx: Secondary | ICD-10-CM | POA: Diagnosis not present

## 2018-08-20 ENCOUNTER — Encounter: Payer: Self-pay | Admitting: *Deleted

## 2018-08-20 NOTE — Progress Notes (Signed)
Oncology Nurse Navigator Documentation  Flowsheet/spreadsheet update.  Rick Demeka Sutter, RN, BSN Head & Neck Oncology Nurse Navigator [facility] at [place] [phone]   

## 2018-08-23 ENCOUNTER — Telehealth: Payer: Self-pay | Admitting: *Deleted

## 2018-08-23 NOTE — Telephone Encounter (Signed)
Oncology Nurse Navigator Documentation  Received call from pt dtr 7/29 indicating her dad had appt Dr. Redmond Baseman on Monday including laryngoscopy, exam WNL, NED.  She indicated he suggested seeing Dr. Isidore Moos in 2 months and him again in late Sept/early October.  Called pt dtr to make her aware of pending 8/10 CT Neck, emphasized importance of completion.  LVMM.  Gayleen Orem, RN, BSN Head & Neck Oncology Nurse [place] at [place] [phone]

## 2018-08-26 ENCOUNTER — Telehealth: Payer: Self-pay | Admitting: *Deleted

## 2018-08-26 NOTE — Telephone Encounter (Signed)
CALLED PATIENT TO INFORM OF CT FOR 09-02-18 - ARRIVAL TIME- 2:15 PM @ [facility], NO RESTRICTIONS TO TEST, PATIENT TO FOLLOW-UP WITH DR. Isidore Moos ON 09-03-18 FOR RESULTS, SPOKE WITH PATIENT AND HE IS AWARE OF THESE APPTS.

## 2018-08-29 ENCOUNTER — Encounter: Payer: Self-pay | Admitting: Radiation Oncology

## 2018-09-02 ENCOUNTER — Encounter: Payer: Self-pay | Admitting: Radiation Oncology

## 2018-09-02 ENCOUNTER — Other Ambulatory Visit: Payer: Self-pay

## 2018-09-02 ENCOUNTER — Ambulatory Visit (HOSPITAL_COMMUNITY)
Admission: RE | Admit: 2018-09-02 | Discharge: 2018-09-02 | Disposition: A | Discharge status: Home / Self Care | Payer: Medicare HMO | Source: Ambulatory Visit | Attending: Radiation Oncology | Admitting: Radiation Oncology

## 2018-09-02 DIAGNOSIS — C320 Malignant neoplasm of glottis: Secondary | ICD-10-CM | POA: Diagnosis not present

## 2018-09-02 DIAGNOSIS — R918 Other nonspecific abnormal finding of lung field: Secondary | ICD-10-CM | POA: Insufficient documentation

## 2018-09-03 ENCOUNTER — Ambulatory Visit
Admission: RE | Admit: 2018-09-03 | Discharge: 2018-09-03 | Disposition: A | Discharge status: Home / Self Care | Payer: Medicare HMO | Source: Ambulatory Visit | Attending: Radiation Oncology | Admitting: Radiation Oncology

## 2018-09-03 ENCOUNTER — Encounter: Payer: Self-pay | Admitting: Radiation Oncology

## 2018-09-03 ENCOUNTER — Telehealth: Payer: Self-pay | Admitting: *Deleted

## 2018-09-03 ENCOUNTER — Encounter: Payer: Self-pay | Admitting: *Deleted

## 2018-09-03 DIAGNOSIS — R634 Abnormal weight loss: Secondary | ICD-10-CM

## 2018-09-03 DIAGNOSIS — C320 Malignant neoplasm of glottis: Secondary | ICD-10-CM

## 2018-09-03 DIAGNOSIS — Z08 Encounter for follow-up examination after completed treatment for malignant neoplasm: Secondary | ICD-10-CM | POA: Diagnosis not present

## 2018-09-03 DIAGNOSIS — Z1329 Encounter for screening for other suspected endocrine disorder: Secondary | ICD-10-CM

## 2018-09-03 DIAGNOSIS — R918 Other nonspecific abnormal finding of lung field: Secondary | ICD-10-CM | POA: Diagnosis not present

## 2018-09-03 HISTORY — DX: Personal history of irradiation: Z92.3

## 2018-09-03 NOTE — Telephone Encounter (Signed)
CALLED PATIENT TO INFORM OF LAB AND FU ON 07-04-19 - 10:45 AM - LAB AND FU ON 07-04-19 @ 11:20 AM, SPOKE WITH PATIENT AND HE IS AWARE OF THESE APPTS.

## 2018-09-03 NOTE — Progress Notes (Signed)
Radiation Oncology         [phone] ________________________________  Name: Ruben Conrad MRN: [id number]  Date: 09/03/2018  DOB: 1940-12-16  Follow-Up Visit Note by phone as patient could not access WebEx and decided to stay home due to pandemic risks  CC: Maury Dus, MD  Melida Quitter, MD  Diagnosis and Prior Radiotherapy:       ICD-10-CM   1. Loss of weight  R63.4   2. Malignant neoplasm of glottis (HCC)  C32.0 TSH  3. Screening for hypothyroidism  Z13.29 TSH     Cancer Staging Malignant neoplasm of glottis (HCC) Staging form: Larynx - Glottis, AJCC 8th Edition - Clinical stage from 03/26/2018: Stage I (cT1b, cN0, cM0) - Signed by Eppie Gibson, MD on 03/26/2018  Radiation Treatment Dates: 04/15/2018 through 05/22/2018 Site Technique Total Dose Dose per Fx Completed Fx Beam Energies  Head & neck: HN_larynx 3D 63/63 2.25 28/28 6X   CHIEF COMPLAINT:  Here for follow-up and surveillance of vocal cord cancer  Narrative:  The patient returns today for routine follow-up.  He underwent CT scan of the chest yesterday, 09/02/2018, for follow-up of a lung nodule. This exam was stable; multiple tiny, 5 mm or less, pulmonary nodules are stable compared to previous exam. No new or suspicious findings identified.  He feels well.  He denies any pain, swallowing issues, skin healing issues, or hoarseness.  His voice is returned to baseline.  He denies any concerns                ALLERGIES:  is allergic to primidone.  Meds: Current Outpatient Medications  Medication Sig Dispense Refill  . aspirin 325 MG tablet Take 325 mg by mouth daily.    Marland Kitchen atenolol (TENORMIN) 50 MG tablet Take 50 mg by mouth daily.   3  . atorvastatin (LIPITOR) 20 MG tablet Take 20 mg by mouth daily.   3  . Calcium Carbonate-Vit D-Min (CALTRATE 600+D PLUS MINERALS) 600-800 MG-UNIT CHEW Chew 1 each by mouth daily.     . carbidopa-levodopa (SINEMET IR) 25-100 MG tablet Take 1 tablet by mouth 3 (three) times daily  before meals. (Patient taking differently: Take 1 tablet by mouth 4 (four) times daily. ) 90 tablet 12  . clopidogrel (PLAVIX) 75 MG tablet Take 75 mg by mouth daily.   3  . docusate sodium (COLACE) 100 MG capsule Take 200 mg by mouth daily.     Marland Kitchen lidocaine (XYLOCAINE) 2 % solution Patient: Mix 1part 2% viscous lidocaine, 1part H20. Swallow 33mL of diluted mixture, 36min before meals and at bedtime, up to QID 100 mL 5  . Multiple Vitamins-Minerals (MULTIVITAMIN WITH MINERALS) tablet Take 1 tablet by mouth daily.     No current facility-administered medications for this encounter.     Physical Findings: The patient is in no acute distress. Patient is alert and oriented. Wt Readings from Last 3 Encounters:  03/26/18 187 lb 8 oz (85 kg)  02/25/18 180 lb (81.6 kg)  12/26/17 180 lb (81.6 kg)    vitals were not taken for this visit. .  General: Alert and oriented, in no acute distress.  Lab Findings: Lab Results  Component Value Date   WBC 9.1 02/25/2018   HGB 15.3 02/25/2018   HCT 43.9 02/25/2018   MCV 101.2 (H) 02/25/2018   PLT 246 02/25/2018    No results found for: TSH  Radiographic Findings: Ct Chest Wo Contrast  Result Date: 09/02/2018 CLINICAL DATA:  Follow-up lung nodule.  Glottic cancer. EXAM: CT CHEST WITHOUT CONTRAST TECHNIQUE: Multidetector CT imaging of the chest was performed following the standard protocol without IV contrast. COMPARISON:  04/05/2018 FINDINGS: Cardiovascular: The heart size is normal. No pericardial effusion. Aortic atherosclerosis. Left main and 3 vessel coronary artery calcifications. Mediastinum/Nodes: No enlarged mediastinal or axillary lymph nodes. Thyroid gland, trachea, and esophagus demonstrate no significant findings. Lungs/Pleura: No pleural effusion. Moderate changes of emphysema. No pleural effusion identified. Multiple scattered tiny pulmonary nodules are again noted. Index nodule within the lateral left upper lobe measures 3 mm and is  unchanged, image 37/7. -index 5 mm anterolateral left upper lobe lung nodule is unchanged, image 47/7. -index 3 mm right middle lobe lung nodule is unchanged, image 96/7. -index anterolateral right lower lobe lung nodule is stable measuring 5 mm, image 97/7. -subpleural nodule in the posteromedial right upper lobe is unchanged measuring 4 mm, image 47/7. -no new or enlarging pulmonary nodules. Upper Abdomen: Calcified granulomas noted in the liver. Gallstones. No acute abnormality identified. Musculoskeletal: No chest wall mass or suspicious bone lesions identified. IMPRESSION: 1. Stable exam. Multiple tiny, 5 mm or less, pulmonary nodules are stable compared with previous exam. 2. No new or suspicious findings identified. 3. Aortic Atherosclerosis (ICD10-I70.0) and Emphysema (ICD10-J43.9). 4. Left main and 3 vessel coronary artery calcifications. Electronically Signed   By: Kerby Moors M.D.   On: 09/02/2018 15:29    Impression/Plan:    1) Head and Neck Cancer Status: No evidence of disease according to laryngoscopy report by Dr. Redmond Baseman in July No symptomatic complaints  2) Nutritional Status: No complaints PEG tube: None  3) Risk Factors: The patient has been educated about risk factors including alcohol and tobacco abuse; they understand that avoidance of alcohol and tobacco is important to prevent recurrences as well as other cancers  4) Swallowing: Good function  5) Thyroid function: No results found for: TSH patient will undergo thyroid lab in the form of a TSH in June 2021 at follow-up  7) Other: He will continue to follow-up with Dr. Redmond Baseman with laryngoscopies as currently the cancer center does not have the infrastructure to perform these  8) Follow-up in 10 months. The patient was encouraged to call with any issues or questions before then.   This encounter was provided by telemedicine platform telephone as patient could not access WebEx The patient has given verbal consent for this  type of encounter and has been advised to only accept a meeting of this type in a secure network environment. The time spent during this encounter was 6 minutes. The attendants for this meeting include Eppie Gibson  and Pearson Forster.  During the encounter, Eppie Gibson was located at [facility] Radiation Oncology Department.  Ingvald Theisen was located at home.  _____________________________________   Eppie Gibson, MD  This document serves as a record of services personally performed by Eppie Gibson, MD. It was created on her behalf by Rae Lips, a trained medical scribe. The creation of this record is based on the scribe's personal observations and the provider's statements to them. This document has been checked and approved by the attending provider.

## 2018-09-03 NOTE — Progress Notes (Signed)
Oncology Nurse Navigator Documentation  To provide care continuity, met joined Ruben Conrad during telemedicine follow-up with Dr. Isidore Moos which included discussion of 8/10 CT Chest.. He voiced understanding of favorable scan results, indication that lung nodules stable, not suggestive of disease. He denied post-XRT dysphagia, throat pain. He reported had appt 7/27 with ENT Redmond Baseman, no concerning issues.  Scheduled to see him again late November. He will RTC to see Dr. Isidore Moos next year at this time. I encouraged him to call me with needs/concerns.  He agreed.  Gayleen Orem, RN, BSN Head & Neck Oncology Nurse [place] [facility] [place] [phone]

## 2018-09-10 ENCOUNTER — Telehealth: Payer: Self-pay | Admitting: Diagnostic Neuroimaging

## 2018-09-10 NOTE — Telephone Encounter (Signed)
Pt's daughter called stating that she is needing to schedule him for his follow up but the pt. refuses to see NP and MD has nothing available until November. She also states that he will be needing a refill on his carbidopa-levodopa (SINEMET IR) 25-100 MG tablet she states that it was changed to QID but on the bottle they still have it as TID. Please advise.

## 2018-09-10 NOTE — Telephone Encounter (Signed)
Spoke with daughter, Kennyth Lose who stated her father agrees to see NP. We scheduled for this Thurs. She understands they need to arrive early to check in, bring insurance cards, wear a mask. Kennyth Lose verbalized understanding, appreciation.

## 2018-09-12 ENCOUNTER — Encounter: Payer: Self-pay | Admitting: Family Medicine

## 2018-09-12 ENCOUNTER — Other Ambulatory Visit: Payer: Self-pay

## 2018-09-12 ENCOUNTER — Ambulatory Visit (INDEPENDENT_AMBULATORY_CARE_PROVIDER_SITE_OTHER): Payer: Medicare HMO | Admitting: Family Medicine

## 2018-09-12 VITALS — BP 94/70 | HR 58 | Temp 97.1°F | Ht 69.0 in | Wt 180.4 lb

## 2018-09-12 DIAGNOSIS — G250 Essential tremor: Secondary | ICD-10-CM

## 2018-09-12 MED ORDER — CARBIDOPA-LEVODOPA 25-100 MG PO TABS: 1.0000 | ORAL_TABLET | Freq: Four times a day (QID) | ORAL | 4 refills | Status: DC

## 2018-09-12 NOTE — Patient Instructions (Signed)
Continue carbadopa/levadopa four times daily  Return in 1 year, soner if needed
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
## 2018-09-24 NOTE — Progress Notes (Signed)
I reviewed note and agree with plan.   Penni Bombard, MD [id number], [id number] PM Certified in Neurology, Neurophysiology and Neuroimaging  [facility] [address], [place], [place] [address] [phone]

## 2018-11-11 DIAGNOSIS — I2510 Atherosclerotic heart disease of native coronary artery without angina pectoris: Secondary | ICD-10-CM | POA: Diagnosis not present

## 2018-11-11 DIAGNOSIS — M810 Age-related osteoporosis without current pathological fracture: Secondary | ICD-10-CM | POA: Diagnosis not present

## 2018-11-11 DIAGNOSIS — E782 Mixed hyperlipidemia: Secondary | ICD-10-CM | POA: Diagnosis not present

## 2018-11-11 DIAGNOSIS — I10 Essential (primary) hypertension: Secondary | ICD-10-CM | POA: Diagnosis not present

## 2019-03-10 ENCOUNTER — Telehealth: Payer: Self-pay

## 2019-03-10 NOTE — Telephone Encounter (Signed)
SLP phoned pt to inquire of any difficulties with swallowing since evaluation in 2020. Upon questioning, pt stated he is not having difficulty and did not want follow up at this time from SLP.

## 2019-03-10 NOTE — Therapy (Signed)
[facility] [address] [place], [place], [address] Phone: [phone]   Fax:  [phone]  Patient Details  Name: Ruben Conrad MRN: [id number] Date of Birth: Jul 08, 1940 Referring Provider:  Eppie Gibson, MD  Encounter Date: 03/10/2019  SPEECH THERAPY DISCHARGE SUMMARY  Visits from Start of Care: 1  Current functional level related to goals / functional outcomes: Pt did not schedule follow up appointments for ST. Goals set at time of eval were as follows:  SLP Short Term Goals - 04/17/18 1227              SLP SHORT TERM GOAL #1    Title  pt will complete HEP with occasional min A      Time  1     Period  --   session    Status  New          SLP SHORT TERM GOAL #2    Title  pt will tell SLP why he is completing HEP      Time  1     Period  --   session    Status  New              SLP Long Term Goals - 04/17/18 1233              SLP LONG TERM GOAL #1    Title  pt will tell SLP how a food journal can foster a quicker return to a more-normalized diet     Time  2     Period  --   vists (visit #3)    Status  New          SLP LONG TERM GOAL #2    Title  pt will tell SLP 3 overt s/s of aspiration PNA with modified independence     Time  2     Period  --   sessions    Status  New          SLP LONG TERM GOAL #3    Title  pt will complete HEP with modified independence     Time  2     Period  --   visits (#3)    Status  New       Remaining deficits: In a visit note from Wynona Meals, RN from August 2020 it states pt denied dysphagia s/sx.    Education / Equipment: HEP, late effects head/neck radiation.  Plan: Patient agrees to discharge.  Patient goals were not met. Patient is being discharged due to not returning since the last visit.  ?????      [place] ,[place], CCC-SLP  03/10/2019, 4:08 PM  [facility] [address] [place], [place], [address] Phone: [phone]   Fax:  [phone]
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
## 2019-07-03 ENCOUNTER — Telehealth: Payer: Self-pay | Admitting: *Deleted

## 2019-07-03 NOTE — Telephone Encounter (Signed)
CALLED PATIENT TO REMIND OF LAB ON 07-04-19 @ 10:45 AM, PRIOR TO FU VISIT WITH DR. Isidore Moos ON 07-04-19, SPOKE WITH PATIENT AND HE IS AWARE OF THESE APPTS.

## 2019-07-04 ENCOUNTER — Telehealth: Payer: Self-pay

## 2019-07-04 ENCOUNTER — Ambulatory Visit
Admission: RE | Admit: 2019-07-04 | Discharge: 2019-07-04 | Disposition: A | Discharge status: Home / Self Care | Payer: Medicare HMO | Source: Ambulatory Visit | Attending: Radiation Oncology | Admitting: Radiation Oncology

## 2019-07-04 ENCOUNTER — Encounter: Payer: Self-pay | Admitting: Radiation Oncology

## 2019-07-04 ENCOUNTER — Other Ambulatory Visit: Payer: Self-pay

## 2019-07-04 VITALS — BP 110/68 | HR 56 | Temp 97.6°F | Resp 20 | Ht 71.0 in | Wt 169.4 lb

## 2019-07-04 DIAGNOSIS — C320 Malignant neoplasm of glottis: Secondary | ICD-10-CM

## 2019-07-04 DIAGNOSIS — Z85818 Personal history of malignant neoplasm of other sites of lip, oral cavity, and pharynx: Secondary | ICD-10-CM | POA: Diagnosis not present

## 2019-07-04 DIAGNOSIS — Z1329 Encounter for screening for other suspected endocrine disorder: Secondary | ICD-10-CM

## 2019-07-04 DIAGNOSIS — E785 Hyperlipidemia, unspecified: Secondary | ICD-10-CM | POA: Diagnosis not present

## 2019-07-04 DIAGNOSIS — Z7982 Long term (current) use of aspirin: Secondary | ICD-10-CM | POA: Insufficient documentation

## 2019-07-04 DIAGNOSIS — Z08 Encounter for follow-up examination after completed treatment for malignant neoplasm: Secondary | ICD-10-CM | POA: Diagnosis not present

## 2019-07-04 DIAGNOSIS — Z85828 Personal history of other malignant neoplasm of skin: Secondary | ICD-10-CM | POA: Diagnosis present

## 2019-07-04 DIAGNOSIS — Z79899 Other long term (current) drug therapy: Secondary | ICD-10-CM | POA: Diagnosis not present

## 2019-07-04 DIAGNOSIS — Z8521 Personal history of malignant neoplasm of larynx: Secondary | ICD-10-CM | POA: Diagnosis not present

## 2019-07-04 LAB — TSH: TSH: 1.73 u[IU]/mL (ref 0.320–4.118)

## 2019-07-04 NOTE — Telephone Encounter (Signed)
-----   Message from Eppie Gibson, MD sent at 07/04/2019 12:24 PM EDT ----- Please let him know TSH was normal, thyroid functioning fine. Thanks! ----- Message ----- From: Buel Ream, Lab In [place] Sent: 07/04/2019  12:14 PM EDT To: Eppie Gibson, MD

## 2019-07-04 NOTE — Telephone Encounter (Signed)
Called patient to let him know that per Dr. Isidore Moos his TSH level was normal and thyriod is functioning fine. Patient verbalized appreciation of call, and denied any other needs/concerns.

## 2019-07-05 ENCOUNTER — Encounter: Payer: Self-pay | Admitting: Radiation Oncology

## 2019-07-05 NOTE — Progress Notes (Signed)
Radiation Oncology         [phone] ________________________________  Name: Ruben Conrad MRN: [id number]  Date: 07/04/2019  DOB: 08-13-1940  Follow-Up Note in person, outpatient  CC: Ruben Dus, MD  Ruben Quitter, MD  Diagnosis and Prior Radiotherapy:       ICD-10-CM   1. Malignant neoplasm of glottis [facility])  C32.0      Cancer Staging Malignant neoplasm of glottis [facility]) Staging form: Larynx - Glottis, AJCC 8th Edition - Clinical stage from 03/26/2018: Stage I (cT1b, cN0, cM0) - Signed by Eppie Gibson, MD on 03/26/2018  Radiation Treatment Dates: 04/15/2018 through 05/22/2018 Site Technique Total Dose Dose per Fx Completed Fx Beam Energies  Head & neck: HN_larynx 3D 63/63 2.25 28/28 6X   CHIEF COMPLAINT:  Here for follow-up and surveillance of vocal cord cancer Mr. Ruben Conrad presents for follow up after completing radiation to the larynx on 05/22/2018.  Pain issues, if any: no throat or neck pain. No new neck masses.  Using a feeding tube?: N/A Weight changes, if any:  Wt Readings from Last 3 Encounters:  07/04/19 169 lb 6.4 oz (76.8 kg)  09/12/18 180 lb 6.4 oz (81.8 kg)  03/26/18 187 lb 8 oz (85 kg)   Swallowing issues, if any: Patient denies. Patient able to eat and drink without difficulty Smoking or chewing tobacco? None Using fluoride trays daily? N/A Last ENT visit was on: Scheduled to see Dr. Melida Conrad on 07/15/2019  Other notable issues, if any: Patient denies any other symptoms or concerns.     ALLERGIES:  is allergic to primidone.  Meds: Current Outpatient Medications  Medication Sig Dispense Refill  . aspirin 325 MG tablet Take 325 mg by mouth daily.    Marland Kitchen atenolol (TENORMIN) 50 MG tablet Take 50 mg by mouth daily.   3  . atorvastatin (LIPITOR) 20 MG tablet Take 20 mg by mouth daily.   3  . Calcium Carbonate-Vit D-Min (CALTRATE 600+D PLUS MINERALS) 600-800 MG-UNIT CHEW Chew 1 each by mouth daily.     . carbidopa-levodopa (SINEMET IR) 25-100 MG  tablet Take 1 tablet by mouth 4 (four) times daily. 360 tablet 4  . clopidogrel (PLAVIX) 75 MG tablet Take 75 mg by mouth daily.   3  . docusate sodium (COLACE) 100 MG capsule Take 200 mg by mouth daily.     . Multiple Vitamins-Minerals (MULTIVITAMIN WITH MINERALS) tablet Take 1 tablet by mouth daily.    Marland Kitchen lidocaine (XYLOCAINE) 2 % solution Patient: Mix 1part 2% viscous lidocaine, 1part H20. Swallow 68mL of diluted mixture, 25min before meals and at bedtime, up to QID (Patient not taking: Reported on 09/12/2018) 100 mL 5   No current facility-administered medications for this encounter.    Physical Findings: The patient is in no acute distress. Patient is alert and oriented. Wt Readings from Last 3 Encounters:  07/04/19 169 lb 6.4 oz (76.8 kg)  09/12/18 180 lb 6.4 oz (81.8 kg)  03/26/18 187 lb 8 oz (85 kg)    height is 5\' 11"  (1.803 m) and weight is 169 lb 6.4 oz (76.8 kg). His temperature is 97.6 F (36.4 C). His blood pressure is 110/68 and his pulse is 56 (abnormal). His respiration is 20 and oxygen saturation is 98%. .  General: Alert and oriented, in no acute distress. Neck: no masses in cervical or supraclavicular neck HEENT: no lesions in mouth or upper throat. Dentures removed.  Lab Findings: Lab Results  Component Value Date   WBC 9.1  02/25/2018   HGB 15.3 02/25/2018   HCT 43.9 02/25/2018   MCV 101.2 (H) 02/25/2018   PLT 246 02/25/2018    Lab Results  Component Value Date   TSH 1.730 07/04/2019    Radiographic Findings: No results found.  Impression/Plan:    1) Head and Neck Cancer Status: No evidence of disease - pending laryngoscopy by Dr. Redmond Conrad this month  2) Nutritional Status: No complaints PEG tube: None  3) Risk Factors: The patient has been educated about risk factors including alcohol and tobacco abuse; they understand that avoidance of alcohol and tobacco is important to prevent recurrences as well as other cancers. He is not chewing, smoking or  vaping.  4) Swallowing: Good function  5) Thyroid function:  Lab Results  Component Value Date   TSH 1.730 07/04/2019  WNL   6) Other: He will continue to follow-up with Dr. Redmond Conrad.  Follow-up in 12 months in rad onc with annual TSH. The patient was encouraged to call with any issues or questions before then.  We discussed measures to reduce the risk of infection during the COVID-19 pandemic. He has been vaccinated.  On date of service, in total, the time spent during this encounter was 25 minutes.  _____________________________________   Eppie Gibson, MD

## 2019-07-15 DIAGNOSIS — Z8521 Personal history of malignant neoplasm of larynx: Secondary | ICD-10-CM | POA: Diagnosis not present

## 2019-07-15 DIAGNOSIS — J343 Hypertrophy of nasal turbinates: Secondary | ICD-10-CM | POA: Diagnosis not present

## 2019-07-15 DIAGNOSIS — J384 Edema of larynx: Secondary | ICD-10-CM | POA: Diagnosis not present

## 2019-07-21 DIAGNOSIS — E782 Mixed hyperlipidemia: Secondary | ICD-10-CM | POA: Diagnosis not present

## 2019-07-21 DIAGNOSIS — Z1389 Encounter for screening for other disorder: Secondary | ICD-10-CM | POA: Diagnosis not present

## 2019-07-21 DIAGNOSIS — I2510 Atherosclerotic heart disease of native coronary artery without angina pectoris: Secondary | ICD-10-CM | POA: Diagnosis not present

## 2019-07-21 DIAGNOSIS — D692 Other nonthrombocytopenic purpura: Secondary | ICD-10-CM | POA: Diagnosis not present

## 2019-07-21 DIAGNOSIS — Z0000 Encounter for general adult medical examination without abnormal findings: Secondary | ICD-10-CM | POA: Diagnosis not present

## 2019-07-21 DIAGNOSIS — I10 Essential (primary) hypertension: Secondary | ICD-10-CM | POA: Diagnosis not present

## 2019-07-21 DIAGNOSIS — C320 Malignant neoplasm of glottis: Secondary | ICD-10-CM | POA: Diagnosis not present

## 2019-07-21 DIAGNOSIS — G250 Essential tremor: Secondary | ICD-10-CM | POA: Diagnosis not present

## 2019-07-21 DIAGNOSIS — M810 Age-related osteoporosis without current pathological fracture: Secondary | ICD-10-CM | POA: Diagnosis not present

## 2019-07-24 ENCOUNTER — Other Ambulatory Visit: Payer: Self-pay | Admitting: Family Medicine

## 2019-07-24 DIAGNOSIS — M810 Age-related osteoporosis without current pathological fracture: Secondary | ICD-10-CM

## 2019-09-18 ENCOUNTER — Encounter: Payer: Self-pay | Admitting: Family Medicine

## 2019-09-18 ENCOUNTER — Ambulatory Visit: Payer: Medicare HMO | Admitting: Family Medicine

## 2019-09-18 ENCOUNTER — Other Ambulatory Visit: Payer: Self-pay

## 2019-09-18 VITALS — BP 109/67 | HR 53 | Ht 70.0 in | Wt 173.0 lb

## 2019-09-18 DIAGNOSIS — G250 Essential tremor: Secondary | ICD-10-CM | POA: Diagnosis not present

## 2019-09-18 MED ORDER — CARBIDOPA-LEVODOPA 25-100 MG PO TABS: 1.0000 | ORAL_TABLET | Freq: Four times a day (QID) | ORAL | 4 refills | Status: DC

## 2019-09-18 NOTE — Progress Notes (Signed)
I reviewed note and agree with plan.   Penni Bombard, MD 7/94/8016, 5:53 PM Certified in Neurology, Neurophysiology and Neuroimaging  [facility] [address] [place], [place] [address] [phone]
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
## 2019-09-18 NOTE — Patient Instructions (Addendum)
  We will continue carbidopa/levodopa 1 tablet four times daily.   Stay well hydrated. Eat a well balanced diet. Try to stay active.   Follow up in 1 year   Tremor A tremor is trembling or shaking that you cannot control. Most tremors affect the hands or arms. Tremors can also affect the head, vocal cords, face, and other parts of the body. There are many types of tremors. Common types include:  Essential tremor. These usually occur in people older than 40. It may run in families and can happen in otherwise healthy people.  Resting tremor. These occur when the muscles are at rest, such as when your hands are resting in your lap. People with Parkinson's disease often have resting tremors.  Postural tremor. These occur when you try to hold a pose, such as keeping your hands outstretched.  Kinetic tremor. These occur during purposeful movement, such as trying to touch a finger to your nose.  Task-specific tremor. These may occur when you perform certain tasks such as writing, speaking, or standing.  Psychogenic tremor. These dramatically lessen or disappear when you are distracted. They can happen in people of all ages. Some types of tremors have no known cause. Tremors can also be a symptom of nervous system problems (neurological disorders) that may occur with aging. Some tremors go away with treatment, while others do not. Follow these instructions at home: Lifestyle      Limit alcohol intake to no more than 1 drink a day for nonpregnant women and 2 drinks a day for men. One drink equals 12 oz of beer, 5 oz of wine, or 1 oz of hard liquor.  Do not use any products that contain nicotine or tobacco, such as cigarettes and e-cigarettes. If you need help quitting, ask your health care provider.  Avoid extreme heat and extreme cold.  Limit your caffeine intake, as told by your health care provider.  Try to get 8 hours of sleep each night.  Find ways to manage your stress, such as  meditation or yoga. General instructions  Take over-the-counter and prescription medicines only as told by your health care provider.  Keep all follow-up visits as told by your health care provider. This is important. Contact a health care provider if you:  Develop a tremor after starting a new medicine.  Have a tremor along with other symptoms such as: ? Numbness. ? Tingling. ? Pain. ? Weakness.  Notice that your tremor gets worse.  Notice that your tremor interferes with your day-to-day life. Summary  A tremor is trembling or shaking that you cannot control.  Most tremors affect the hands or arms.  Some types of tremors have no known cause. Others may be a symptom of nervous system problems (neurological disorders).  Make sure you discuss any tremors you have with your health care provider. This information is not intended to replace advice given to you by your health care provider. Make sure you discuss any questions you have with your health care provider. Document Revised: 12/22/2016 Document Reviewed: 11/09/2016 Elsevier Patient Education  2020 [employer].

## 2019-10-22 ENCOUNTER — Other Ambulatory Visit: Payer: Medicare HMO

## 2019-12-21 IMAGING — CT CT CHEST WITHOUT CONTRAST
2 of 3 series · 15 of 36 positions shown, 18 images · non-contrast
Comparison: 04/05/2018

CLINICAL DATA: Follow-up lung nodule.  Glottic cancer.

EXAM:
CT CHEST WITHOUT CONTRAST
TECHNIQUE: Multidetector CT imaging of the chest was performed following the
standard protocol without IV contrast.

[Series 2: thorax · axial · 0.84mm/px · z∈[-168,+108]mm · 12 of 162 slices shown, 15 images]
[im 12/162  mediastinal]
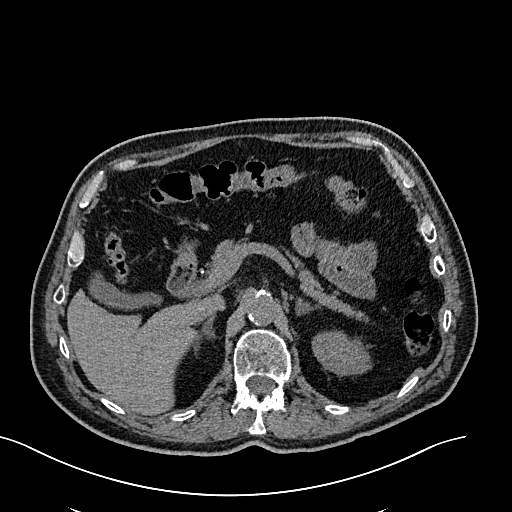
[im 12/162  lung]
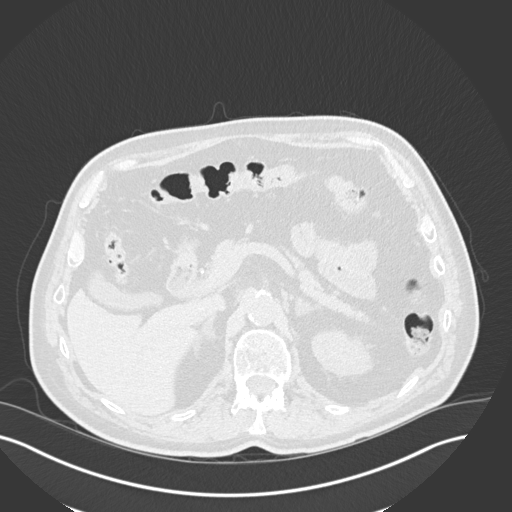
[im 24/162  lung]
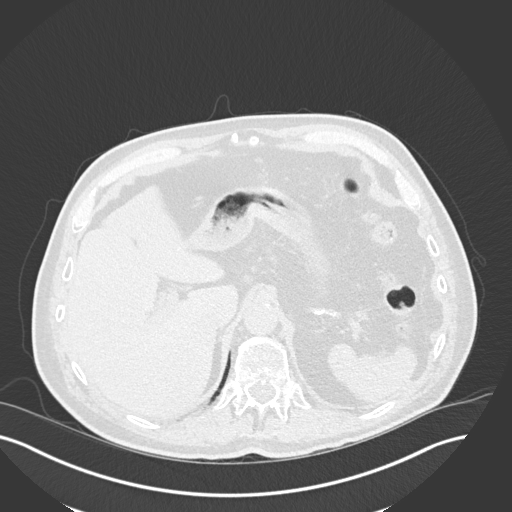
[im 36/162  lung]
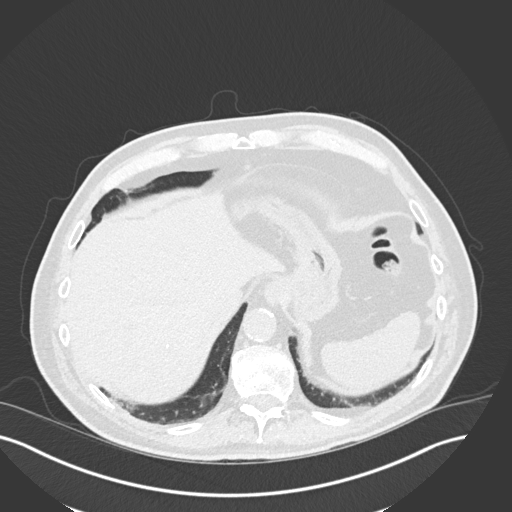
[im 48/162  lung]
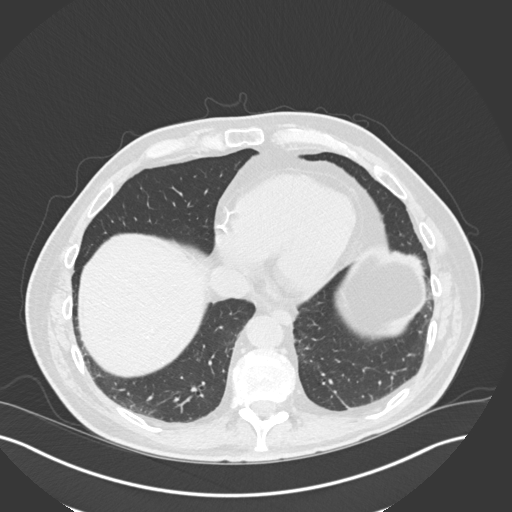
[im 60/162  mediastinal]
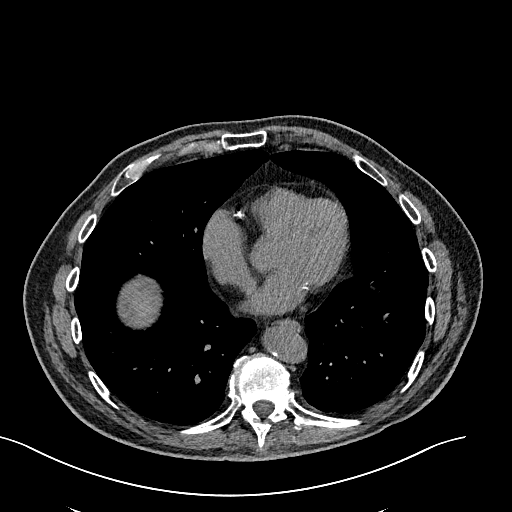
[im 60/162  lung]
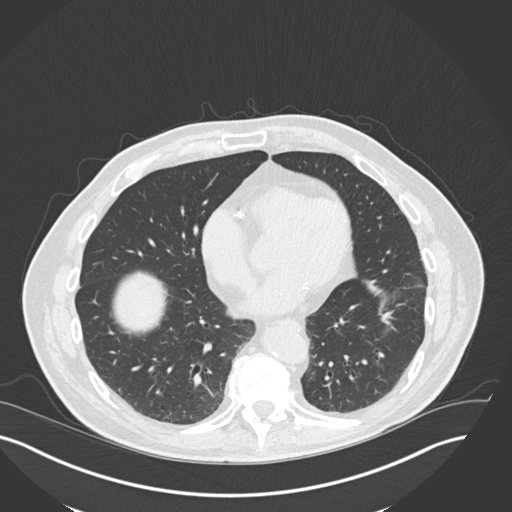
[im 72/162  lung]
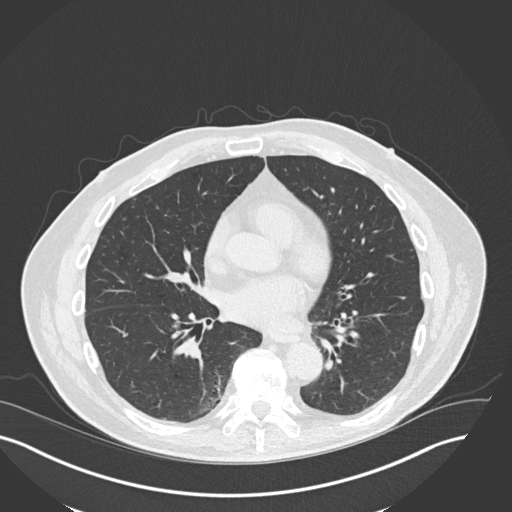
[im 90/162  lung]
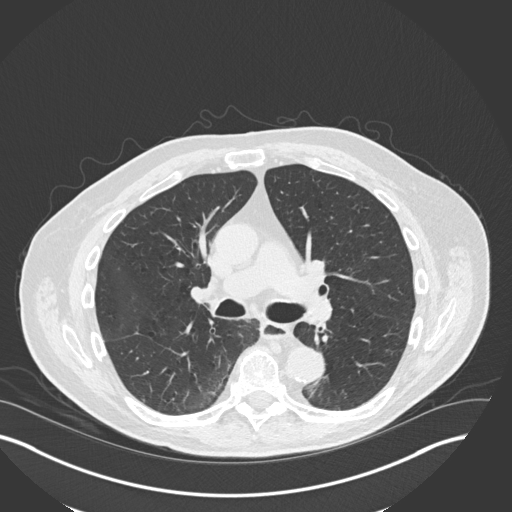
[im 102/162  lung]
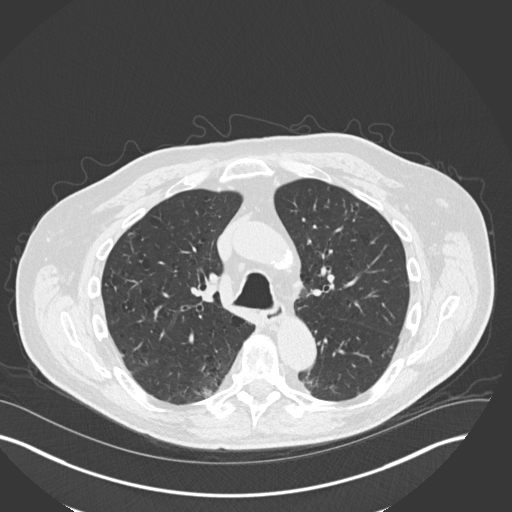
[im 114/162  mediastinal]
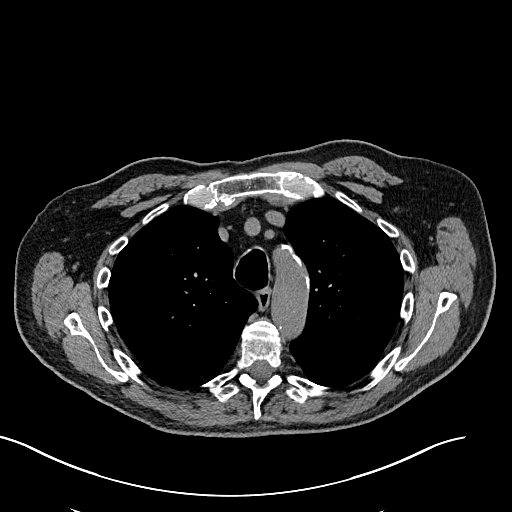
[im 114/162  lung]
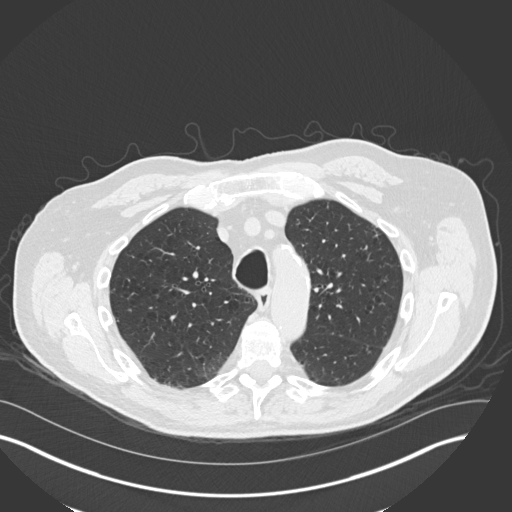
[im 126/162  lung]
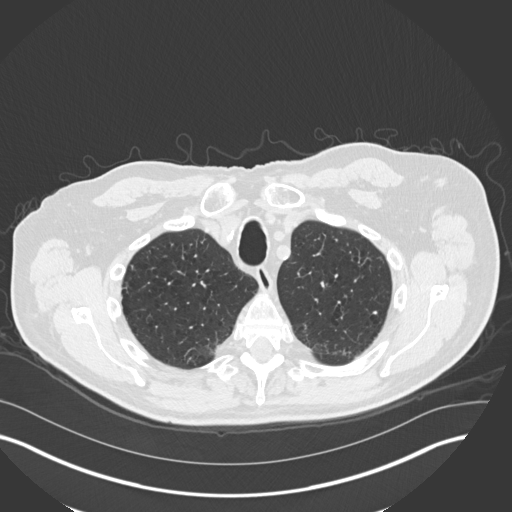
[im 138/162  lung]
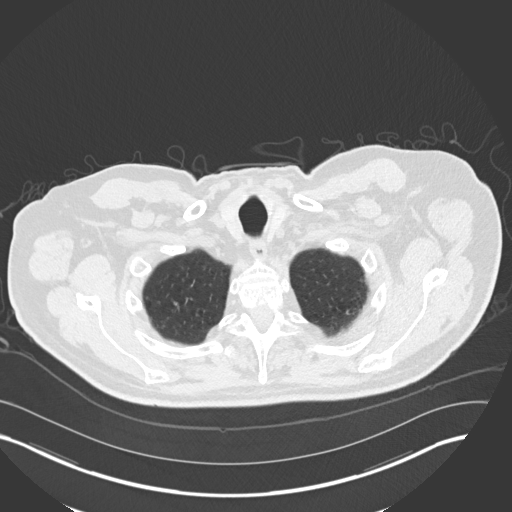
[im 150/162  lung]
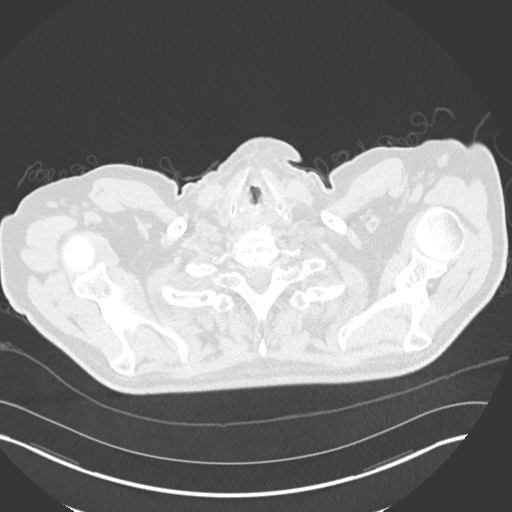

[Series 5: coronal · coronal · 0.64mm/px · 3 of 150 slices shown]
[im 30/150  lung]
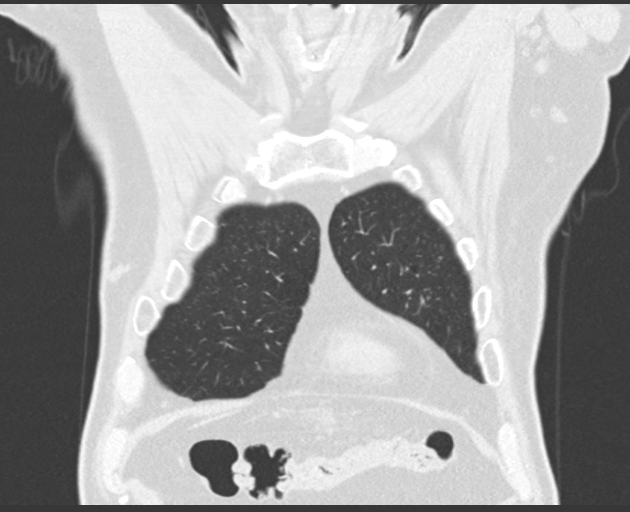
[im 60/150  lung]
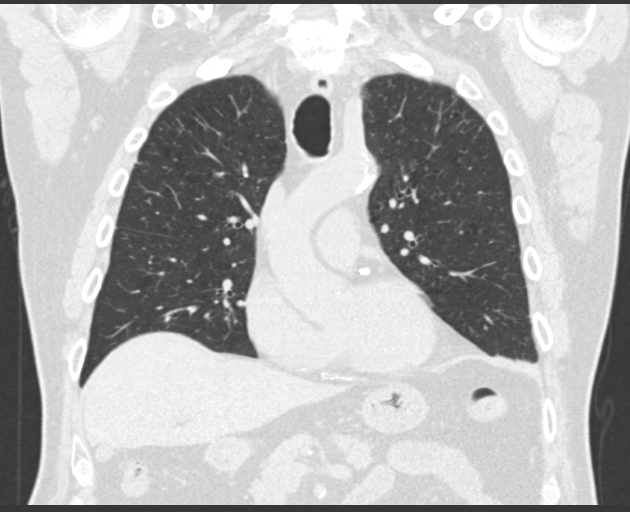
[im 90/150  lung]
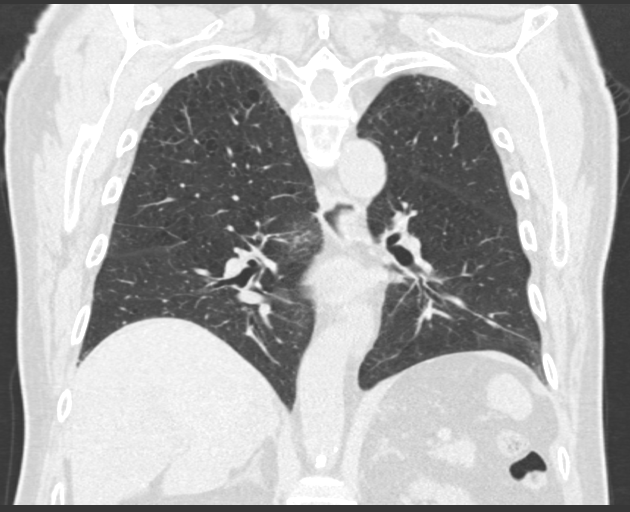

[15 of 36 positions shown; findings below may reference images not displayed]

FINDINGS: Cardiovascular: The heart size is normal. No pericardial effusion.
Aortic atherosclerosis. Left main and 3 vessel coronary artery
calcifications.

Mediastinum/Nodes: No enlarged mediastinal or axillary lymph nodes.
Thyroid gland, trachea, and esophagus demonstrate no significant
findings.

Lungs/Pleura: No pleural effusion. Moderate changes of emphysema. No
pleural effusion identified. Multiple scattered tiny pulmonary
nodules are again noted. Index nodule within the lateral left upper
lobe measures 3 mm and is unchanged, image 37/7.

-index 5 mm anterolateral left upper lobe lung nodule is unchanged,
image 47/7.

-index 3 mm right middle lobe lung nodule is unchanged, image 96/7.

-index anterolateral right lower lobe lung nodule is stable
measuring 5 mm, image 97/7.

-subpleural nodule in the posteromedial right upper lobe is
unchanged measuring 4 mm, image 47/7.

-no new or enlarging pulmonary nodules.

Upper Abdomen: Calcified granulomas noted in the liver. Gallstones.
No acute abnormality identified.

Musculoskeletal: No chest wall mass or suspicious bone lesions
identified.
IMPRESSION: 1. Stable exam. Multiple tiny, 5 mm or less, pulmonary nodules are
stable compared with previous exam.
2. No new or suspicious findings identified.
3. Aortic Atherosclerosis (R6C89-EIR.R) and Emphysema (R6C89-9X5.V).
4. Left main and 3 vessel coronary artery calcifications.

## 2020-02-04 DIAGNOSIS — G250 Essential tremor: Secondary | ICD-10-CM | POA: Diagnosis not present

## 2020-02-04 DIAGNOSIS — M810 Age-related osteoporosis without current pathological fracture: Secondary | ICD-10-CM | POA: Diagnosis not present

## 2020-02-04 DIAGNOSIS — I10 Essential (primary) hypertension: Secondary | ICD-10-CM | POA: Diagnosis not present

## 2020-02-04 DIAGNOSIS — E782 Mixed hyperlipidemia: Secondary | ICD-10-CM | POA: Diagnosis not present

## 2020-02-04 DIAGNOSIS — I2510 Atherosclerotic heart disease of native coronary artery without angina pectoris: Secondary | ICD-10-CM | POA: Diagnosis not present

## 2020-02-04 DIAGNOSIS — Z6826 Body mass index (BMI) 26.0-26.9, adult: Secondary | ICD-10-CM | POA: Diagnosis not present

## 2020-02-04 DIAGNOSIS — C320 Malignant neoplasm of glottis: Secondary | ICD-10-CM | POA: Diagnosis not present

## 2020-02-04 DIAGNOSIS — D692 Other nonthrombocytopenic purpura: Secondary | ICD-10-CM | POA: Diagnosis not present

## 2020-07-06 ENCOUNTER — Other Ambulatory Visit: Payer: Self-pay

## 2020-07-06 ENCOUNTER — Encounter: Payer: Self-pay | Admitting: Radiation Oncology

## 2020-07-06 ENCOUNTER — Ambulatory Visit
Admission: RE | Admit: 2020-07-06 | Discharge: 2020-07-06 | Disposition: A | Discharge status: Home / Self Care | Payer: Medicare HMO | Source: Ambulatory Visit | Attending: Radiation Oncology | Admitting: Radiation Oncology

## 2020-07-06 DIAGNOSIS — Z7982 Long term (current) use of aspirin: Secondary | ICD-10-CM | POA: Diagnosis not present

## 2020-07-06 DIAGNOSIS — Z85818 Personal history of malignant neoplasm of other sites of lip, oral cavity, and pharynx: Secondary | ICD-10-CM | POA: Insufficient documentation

## 2020-07-06 DIAGNOSIS — C320 Malignant neoplasm of glottis: Secondary | ICD-10-CM

## 2020-07-06 DIAGNOSIS — Z79899 Other long term (current) drug therapy: Secondary | ICD-10-CM | POA: Diagnosis not present

## 2020-07-06 DIAGNOSIS — Z08 Encounter for follow-up examination after completed treatment for malignant neoplasm: Secondary | ICD-10-CM | POA: Diagnosis not present

## 2020-07-06 DIAGNOSIS — Z8521 Personal history of malignant neoplasm of larynx: Secondary | ICD-10-CM | POA: Diagnosis not present

## 2020-07-06 MED ORDER — OXYMETAZOLINE HCL 0.05 % NA SOLN
2.0000 | Freq: Once | NASAL | Status: AC
  Administered 2020-07-06: 2 via NASAL
  Filled 2020-07-06: qty 30

## 2020-07-06 NOTE — Progress Notes (Signed)
Ruben Conrad presents for follow up after completing radiation to the larynx on 05/22/2018.  Pain issues, if any: Patient denies pain. Using a feeding tube?: N/A Weight changes, if any: Weight remains stable.  Swallowing issues, if any: No Smoking or chewing tobacco? no Using fluoride trays daily? N/A Last ENT visit was on: 07/15/2019 Saw Dr. Melida Quitter:  "--The fiberoptic laryngoscope was then placed through the nasal passage to view the pharynx and larynx. After completion, the telescope was removed. Findings included normal nasal passages, no mass or abnormality in the nasopharynx, and no mass or ulceration in the pharynx or larynx. Pyriform sinuses are open. Secretions are minimal. Vocal folds are without mass, scarring, or ulceration. The vocal folds adduct and abduct symmetrically. There is good glottal closure. Muscle tension patterns are not present. Laryngeal edema is mild with apost-radiation appearance. --There is no evidence of recurrence having finished treatment in April 2020. He is now scheduled to follow-up with Radiation Oncology for one year. I discussed this long interval with Dr. Isidore Moos. I will have him follow-up with me in four months."   Other notable issues, if any: none  Vitals:   07/06/20 1400  BP: 114/72  Pulse: (!) 58  Resp: 18  Temp: 97.8 F (36.6 C)  SpO2: 97%  Weight: 170 lb 9.6 oz (77.4 kg)  Height: 5\' 11"  (1.803 m)

## 2020-07-08 ENCOUNTER — Encounter: Payer: Self-pay | Admitting: Radiation Oncology

## 2020-07-08 NOTE — Progress Notes (Signed)
Radiation Oncology         [phone] ________________________________  Name: Ruben Conrad MRN: [id number]  Date: 07/06/2020  DOB: [birth year]  Follow-Up Note in person, outpatient  CC: Maury Dus, MD  Melida Quitter, MD  Diagnosis and Prior Radiotherapy:       ICD-10-CM   1. Malignant neoplasm of glottis (HCC)  C32.0 oxymetazoline (AFRIN) 0.05 % nasal spray 2 spray    Fiberoptic laryngoscopy       Cancer Staging Malignant neoplasm of glottis [facility]) Staging form: Larynx - Glottis, AJCC 8th Edition - Clinical stage from 03/26/2018: Stage I (cT1b, cN0, cM0) - Signed by Eppie Gibson, MD on 03/26/2018  Radiation Treatment Dates: 04/15/2018 through 05/22/2018 Site Technique Total Dose Dose per Fx Completed Fx Beam Energies  Head & neck: HN_larynx 3D 63/63 2.25 28/28 6X   CHIEF COMPLAINT:  here for checkup  NARRATIVE: Mr. Pasquarelli presents for follow up after completing radiation to the larynx on 05/22/2018.  Pain issues, if any: Patient denies pain. Using a feeding tube?: N/A Weight changes, if any: Weight remains stable.  Swallowing issues, if any: No Smoking or chewing tobacco? no Using fluoride trays daily? N/A Last ENT visit was on: 07/15/2019 - a year ago - Saw Dr. Melida Quitter  Other notable issues, if any: none  Vitals:   07/06/20 1400  BP: 114/72  Pulse: (!) 58  Resp: 18  Temp: 97.8 F (36.6 C)  SpO2: 97%  Weight: 170 lb 9.6 oz (77.4 kg)  Height: 5\' 11"  (1.803 m)        ALLERGIES:  is allergic to primidone.  Meds: Current Outpatient Medications  Medication Sig Dispense Refill   aspirin 325 MG tablet Take 325 mg by mouth daily.     atorvastatin (LIPITOR) 20 MG tablet Take 20 mg by mouth daily.   3   Calcium Carbonate-Vit D-Min (CALTRATE 600+D PLUS MINERALS) 600-800 MG-UNIT CHEW Chew 1 each by mouth daily.      carbidopa-levodopa (SINEMET IR) 25-100 MG tablet Take 1 tablet by mouth 4 (four) times daily. 360 tablet 4   clopidogrel (PLAVIX) 75 MG  tablet Take 75 mg by mouth daily.   3   docusate sodium (COLACE) 100 MG capsule Take 200 mg by mouth daily.      Multiple Vitamins-Minerals (MULTIVITAMIN WITH MINERALS) tablet Take 1 tablet by mouth daily.     nystatin (MYCOSTATIN) 100000 UNIT/ML suspension Take 5 mLs by mouth 4 (four) times daily.     nystatin ointment (MYCOSTATIN) Apply 1 application topically 2 (two) times daily.     atenolol (TENORMIN) 50 MG tablet Take 50 mg by mouth daily.  (Patient not taking: Reported on 07/06/2020)  3   No current facility-administered medications for this encounter.    Physical Findings: The patient is in no acute distress. Patient is alert and oriented. Wt Readings from Last 3 Encounters:  07/06/20 170 lb 9.6 oz (77.4 kg)  09/18/19 173 lb (78.5 kg)  07/04/19 169 lb 6.4 oz (76.8 kg)    height is 5\' 11"  (1.803 m) and weight is 170 lb 9.6 oz (77.4 kg). His temperature is 97.8 F (36.6 C). His blood pressure is 114/72 and his pulse is 58 (abnormal). His respiration is 18 and oxygen saturation is 97%. .  General: Alert and oriented, in no acute distress. Neck: no masses in cervical or supraclavicular neck HEENT: no lesions in mouth or upper throat. Dentures removed. HEART RRR CHEST CTAB  PROCEDURE NOTE: After obtaining consent and  anesthetizing the nasal cavity with topical decongestant (oxymetazoline), the flexible endoscope was introduced and passed through the nasal cavity.  The nasopharynx, oropharynx, larynx, and hypopharynx were then examined. No evidence of malignancy.  True cords symmetrically mobile, no nodules   Lab Findings: Lab Results  Component Value Date   WBC 9.1 02/25/2018   HGB 15.3 02/25/2018   HCT 43.9 02/25/2018   MCV 101.2 (H) 02/25/2018   PLT 246 02/25/2018    Lab Results  Component Value Date   TSH 1.730 07/04/2019    Radiographic Findings: No results found.  Impression/Plan:    1) Head and Neck Cancer Status: No evidence of disease    2) Nutritional  Status: No complaints PEG tube: None  Wt Readings from Last 3 Encounters:  07/06/20 170 lb 9.6 oz (77.4 kg)  09/18/19 173 lb (78.5 kg)  07/04/19 169 lb 6.4 oz (76.8 kg)     3) Risk Factors: The patient has been educated about risk factors including alcohol and tobacco abuse; they understand that avoidance of alcohol and tobacco is important to prevent recurrences as well as other cancers. He is not chewing, smoking or vaping.  4) Swallowing: Good function  5) Thyroid function:  no sign of hypothyroidism - will check TSH for screening at next appointment   6) Other:  follow-up with Dr. Redmond Baseman - daughter will call for 6 mo f/u.  Follow-up in 12 months in rad onc with TSH. The patient was encouraged to call with any issues or questions before then.   On date of service, in total, the time spent during this encounter was 25 minutes.  _____________________________________   Eppie Gibson, MD

## 2020-08-02 DIAGNOSIS — D692 Other nonthrombocytopenic purpura: Secondary | ICD-10-CM | POA: Diagnosis not present

## 2020-08-02 DIAGNOSIS — I10 Essential (primary) hypertension: Secondary | ICD-10-CM | POA: Diagnosis not present

## 2020-08-02 DIAGNOSIS — E782 Mixed hyperlipidemia: Secondary | ICD-10-CM | POA: Diagnosis not present

## 2020-08-02 DIAGNOSIS — I25119 Atherosclerotic heart disease of native coronary artery with unspecified angina pectoris: Secondary | ICD-10-CM | POA: Diagnosis not present

## 2020-08-02 DIAGNOSIS — Z1389 Encounter for screening for other disorder: Secondary | ICD-10-CM | POA: Diagnosis not present

## 2020-08-02 DIAGNOSIS — Z0000 Encounter for general adult medical examination without abnormal findings: Secondary | ICD-10-CM | POA: Diagnosis not present

## 2020-08-02 DIAGNOSIS — G250 Essential tremor: Secondary | ICD-10-CM | POA: Diagnosis not present

## 2020-08-02 DIAGNOSIS — Z8589 Personal history of malignant neoplasm of other organs and systems: Secondary | ICD-10-CM | POA: Diagnosis not present

## 2020-08-02 DIAGNOSIS — M810 Age-related osteoporosis without current pathological fracture: Secondary | ICD-10-CM | POA: Diagnosis not present

## 2020-08-04 ENCOUNTER — Other Ambulatory Visit: Payer: Self-pay | Admitting: Family Medicine

## 2020-08-04 DIAGNOSIS — M810 Age-related osteoporosis without current pathological fracture: Secondary | ICD-10-CM

## 2020-09-20 ENCOUNTER — Encounter: Payer: Self-pay | Admitting: *Deleted

## 2020-09-20 ENCOUNTER — Ambulatory Visit: Payer: Medicare HMO | Admitting: Family Medicine

## 2020-09-22 ENCOUNTER — Ambulatory Visit: Payer: Medicare HMO | Admitting: Diagnostic Neuroimaging

## 2020-09-22 ENCOUNTER — Encounter: Payer: Self-pay | Admitting: Diagnostic Neuroimaging

## 2020-09-22 ENCOUNTER — Other Ambulatory Visit: Payer: Self-pay

## 2020-09-22 VITALS — BP 118/74 | HR 47 | Ht 71.0 in | Wt 172.0 lb

## 2020-09-22 DIAGNOSIS — G250 Essential tremor: Secondary | ICD-10-CM

## 2020-09-22 MED ORDER — CARBIDOPA-LEVODOPA 25-100 MG PO TABS: 1.0000 | ORAL_TABLET | Freq: Two times a day (BID) | ORAL | 4 refills | Status: DC
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
## 2021-07-12 ENCOUNTER — Ambulatory Visit
Admission: RE | Admit: 2021-07-12 | Discharge: 2021-07-12 | Disposition: A | Discharge status: Home / Self Care | Payer: Medicare HMO | Source: Ambulatory Visit | Attending: Radiation Oncology | Admitting: Radiation Oncology

## 2021-07-12 ENCOUNTER — Other Ambulatory Visit: Payer: Self-pay

## 2021-07-12 ENCOUNTER — Encounter: Payer: Self-pay | Admitting: Radiation Oncology

## 2021-07-12 VITALS — BP 102/67 | HR 58 | Temp 98.7°F | Resp 18 | Wt 173.8 lb

## 2021-07-12 DIAGNOSIS — C320 Malignant neoplasm of glottis: Secondary | ICD-10-CM | POA: Insufficient documentation

## 2021-07-12 DIAGNOSIS — Z923 Personal history of irradiation: Secondary | ICD-10-CM | POA: Diagnosis not present

## 2021-07-12 DIAGNOSIS — H6191 Disorder of right external ear, unspecified: Secondary | ICD-10-CM

## 2021-07-12 MED ORDER — NYSTATIN 100000 UNIT/ML MT SUSP: 5.0000 mL | Freq: Four times a day (QID) | OROMUCOSAL | 0 refills | Status: DC

## 2021-07-12 MED ORDER — OXYMETAZOLINE HCL 0.05 % NA SOLN
2.0000 | Freq: Once | NASAL | Status: AC
  Administered 2021-07-12: 2 via NASAL
  Filled 2021-07-12: qty 30

## 2021-07-12 NOTE — Progress Notes (Signed)
Radiation Oncology         [phone] ________________________________  Name: Ruben Conrad MRN: [id number]  Date: 07/12/2021  DOB: 05-Jun-1940  Follow-Up Note in person, outpatient  CC: Maury Dus, MD  Melida Quitter, MD  Diagnosis and Prior Radiotherapy:       ICD-10-CM   1. Skin lesion of right ear  H61.91 Ambulatory referral to Dermatology    2. Malignant neoplasm of glottis (HCC)  C32.0 TSH    oxymetazoline (AFRIN) 0.05 % nasal spray 2 spray    Fiberoptic laryngoscopy    nystatin (MYCOSTATIN) 100000 UNIT/ML suspension    Ambulatory referral to Dermatology        Cancer Staging  Malignant neoplasm of glottis [facility]) Staging form: Larynx - Glottis, AJCC 8th Edition - Clinical stage from 03/26/2018: Stage I (cT1b, cN0, cM0) - Signed by Eppie Gibson, MD on 03/26/2018   Radiation Treatment Dates: 04/15/2018 through 05/22/2018 Site Technique Total Dose Dose per Fx Completed Fx Beam Energies  Head & neck: HN_larynx 3D 63/63 2.25 28/28 6X   CHIEF COMPLAINT:  here for checkup  NARRATIVE:  Ruben Conrad presents for follow up after completing radiation to the larynx on 05/22/2018.  Pain issues, if any: 5/10 Oral irritation Using a feeding tube?: N/A Weight changes, if any: None  Swallowing issues, if any: Yes, white patches on tongue w/ sore throat. Smoking or chewing tobacco? No Using fluoride trays daily? N/A--dental issues? None Last ENT visit was on: Not since he saw Dr. Melida Quitter on 07/15/2019 Other notable issues, if any: None     ALLERGIES:  is allergic to primidone and propranolol.  Meds: Current Outpatient Medications  Medication Sig Dispense Refill   famotidine (PEPCID) 20 MG tablet Take 20 mg by mouth 2 (two) times daily.     fexofenadine (ALLEGRA) 60 MG tablet Take 60 mg by mouth 2 (two) times daily.     aspirin 325 MG tablet Take 325 mg by mouth daily.     atenolol (TENORMIN) 50 MG tablet Take 50 mg by mouth daily.  3   atorvastatin (LIPITOR) 20  MG tablet Take 20 mg by mouth daily.   3   Calcium Carbonate-Vit D-Min (CALTRATE 600+D PLUS MINERALS) 600-800 MG-UNIT CHEW Chew 1 each by mouth daily.      carbidopa-levodopa (SINEMET IR) 25-100 MG tablet Take 1 tablet by mouth 2 (two) times daily. 180 tablet 4   clopidogrel (PLAVIX) 75 MG tablet Take 75 mg by mouth daily.   3   docusate sodium (COLACE) 100 MG capsule Take 200 mg by mouth daily.      Multiple Vitamins-Minerals (MULTIVITAMIN WITH MINERALS) tablet Take 1 tablet by mouth daily.     nystatin (MYCOSTATIN) 100000 UNIT/ML suspension Take 5 mLs (500,000 Units total) by mouth 4 (four) times daily. Swish in mouth for 60 seconds, then swallow. Use for about 3 weeks, or until bottle is empty. 473 mL 0   nystatin ointment (MYCOSTATIN) Apply 1 application topically 2 (two) times daily. (Patient not taking: Reported on 07/12/2021)     No current facility-administered medications for this encounter.    Physical Findings: The patient is in no acute distress. Patient is alert and oriented. Wt Readings from Last 3 Encounters:  07/12/21 173 lb 12.8 oz (78.8 kg)  09/22/20 172 lb (78 kg)  07/06/20 170 lb 9.6 oz (77.4 kg)    weight is 173 lb 12.8 oz (78.8 kg). His oral temperature is 98.7 F (37.1 C). His blood pressure is 102/67  and his pulse is 58 (abnormal). His respiration is 18 and oxygen saturation is 98%. . General: Alert and oriented, in no acute distress. Neck: no masses in cervical or supraclavicular neck HEENT: Dentures removed.  He has oral thrush.  No other lesions appreciated in the mouth or throat otherwise HEART RRR CHEST CTAB Skin: He has a crusted lesion over the external right ear.  Daughter states this is new.  PROCEDURE NOTE: After obtaining consent and anesthetizing the nasal cavity with topical decongestant (oxymetazoline), the flexible endoscope was coated with lidocaine gel and introduced and passed through the nasal cavity.  The nasopharynx, oropharynx, larynx, and  hypopharynx were then examined. No evidence of malignancy.  True cords symmetrically mobile, no nodules   Lab Findings: Lab Results  Component Value Date   WBC 9.1 02/25/2018   HGB 15.3 02/25/2018   HCT 43.9 02/25/2018   MCV 101.2 (H) 02/25/2018   PLT 246 02/25/2018    Lab Results  Component Value Date   TSH 1.730 07/04/2019    Radiographic Findings: No results found.  Impression/Plan:    1) Head and Neck Cancer Status: No evidence of disease    2) Nutritional Status: No complaints PEG tube: None  Wt Readings from Last 3 Encounters:  07/12/21 173 lb 12.8 oz (78.8 kg)  09/22/20 172 lb (78 kg)  07/06/20 170 lb 9.6 oz (77.4 kg)     3) Risk Factors: The patient has been educated about risk factors including alcohol and tobacco abuse; they understand that avoidance of alcohol and tobacco is important to prevent recurrences as well as other cancers. He is not chewing, smoking or vaping.  4) Swallowing: Good function  5) Thyroid function: Labs pending today Lab Results  Component Value Date   TSH 1.730 07/04/2019    6) Other:  follow-up with Dr. Redmond Baseman - daughter will call for 6 mo f/u.  Follow-up in 12 months in rad onc with TSH. The patient was encouraged to call with any issues or questions before then.   7) Referral to dermatology for possible cancerous lesion on ear.  Patient would benefit from regular full-body skin checks  8) Prescription for nystatin given for thrush   On date of service, in total, the time spent during this encounter was 25 minutes.  _____________________________________   Eppie Gibson, MD

## 2021-07-12 NOTE — Progress Notes (Signed)
Ruben Conrad presents for follow up after completing radiation to the larynx on 05/22/2018.  Pain issues, if any: 5/10 Oral irritation Using a feeding tube?: N/A Weight changes, if any: None  Swallowing issues, if any: Yes, white patches on tongue w/ sore throat. Smoking or chewing tobacco? No Using fluoride trays daily? N/A--dental issues? None Last ENT visit was on: Not since he saw Ruben Conrad on 07/15/2019 Other notable issues, if any: None

## 2021-07-13 LAB — TSH: TSH: 1.499 u[IU]/mL (ref 0.350–4.500)

## 2021-07-14 ENCOUNTER — Telehealth: Payer: Self-pay | Admitting: *Deleted

## 2021-07-14 NOTE — Telephone Encounter (Signed)
CALLED PATIENT TO INFORM OF APPT. WITH DR. Elvera Lennox ON 08-08-21- ARRIVAL TIME- 8:45 AM- ADDRESS- [place], [place], SPOKE WITH PATIENT'S DAUGHTER- JACKIE AND SHE IS AWARE OF THIS APPT.

## 2021-08-08 DIAGNOSIS — C44311 Basal cell carcinoma of skin of nose: Secondary | ICD-10-CM | POA: Diagnosis not present

## 2021-08-08 DIAGNOSIS — C44622 Squamous cell carcinoma of skin of right upper limb, including shoulder: Secondary | ICD-10-CM | POA: Diagnosis not present

## 2021-08-08 DIAGNOSIS — C44321 Squamous cell carcinoma of skin of nose: Secondary | ICD-10-CM | POA: Diagnosis not present

## 2021-08-08 DIAGNOSIS — C44319 Basal cell carcinoma of skin of other parts of face: Secondary | ICD-10-CM | POA: Diagnosis not present

## 2021-08-08 DIAGNOSIS — L814 Other melanin hyperpigmentation: Secondary | ICD-10-CM | POA: Diagnosis not present

## 2021-08-08 DIAGNOSIS — L570 Actinic keratosis: Secondary | ICD-10-CM | POA: Diagnosis not present

## 2021-08-08 DIAGNOSIS — L821 Other seborrheic keratosis: Secondary | ICD-10-CM | POA: Diagnosis not present

## 2021-09-06 DIAGNOSIS — Z0000 Encounter for general adult medical examination without abnormal findings: Secondary | ICD-10-CM | POA: Diagnosis not present

## 2021-09-06 DIAGNOSIS — I25119 Atherosclerotic heart disease of native coronary artery with unspecified angina pectoris: Secondary | ICD-10-CM | POA: Diagnosis not present

## 2021-09-06 DIAGNOSIS — Z1331 Encounter for screening for depression: Secondary | ICD-10-CM | POA: Diagnosis not present

## 2021-09-06 DIAGNOSIS — J439 Emphysema, unspecified: Secondary | ICD-10-CM | POA: Diagnosis not present

## 2021-09-06 DIAGNOSIS — G250 Essential tremor: Secondary | ICD-10-CM | POA: Diagnosis not present

## 2021-09-06 DIAGNOSIS — I10 Essential (primary) hypertension: Secondary | ICD-10-CM | POA: Diagnosis not present

## 2021-09-06 DIAGNOSIS — E782 Mixed hyperlipidemia: Secondary | ICD-10-CM | POA: Diagnosis not present

## 2021-09-06 DIAGNOSIS — I700 Atherosclerosis of aorta: Secondary | ICD-10-CM | POA: Diagnosis not present

## 2021-09-06 DIAGNOSIS — Z8589 Personal history of malignant neoplasm of other organs and systems: Secondary | ICD-10-CM | POA: Diagnosis not present

## 2021-09-06 DIAGNOSIS — D692 Other nonthrombocytopenic purpura: Secondary | ICD-10-CM | POA: Diagnosis not present

## 2021-09-06 DIAGNOSIS — M810 Age-related osteoporosis without current pathological fracture: Secondary | ICD-10-CM | POA: Diagnosis not present

## 2021-09-07 ENCOUNTER — Other Ambulatory Visit: Payer: Self-pay | Admitting: Family Medicine

## 2021-09-07 DIAGNOSIS — Z85828 Personal history of other malignant neoplasm of skin: Secondary | ICD-10-CM | POA: Diagnosis not present

## 2021-09-07 DIAGNOSIS — Z13820 Encounter for screening for osteoporosis: Secondary | ICD-10-CM

## 2021-09-07 DIAGNOSIS — C44622 Squamous cell carcinoma of skin of right upper limb, including shoulder: Secondary | ICD-10-CM | POA: Diagnosis not present

## 2021-09-07 DIAGNOSIS — L988 Other specified disorders of the skin and subcutaneous tissue: Secondary | ICD-10-CM | POA: Diagnosis not present

## 2021-09-21 DIAGNOSIS — C44321 Squamous cell carcinoma of skin of nose: Secondary | ICD-10-CM | POA: Diagnosis not present

## 2021-09-21 DIAGNOSIS — C4401 Basal cell carcinoma of skin of lip: Secondary | ICD-10-CM | POA: Diagnosis not present

## 2021-09-21 DIAGNOSIS — Z85828 Personal history of other malignant neoplasm of skin: Secondary | ICD-10-CM | POA: Diagnosis not present

## 2021-09-27 ENCOUNTER — Telehealth: Payer: Medicare HMO | Admitting: Diagnostic Neuroimaging

## 2021-09-28 ENCOUNTER — Telehealth: Payer: Medicare HMO | Admitting: Diagnostic Neuroimaging

## 2021-10-23 HISTORY — PX: SKIN SURGERY: SHX2413

## 2021-10-27 DIAGNOSIS — Z8521 Personal history of malignant neoplasm of larynx: Secondary | ICD-10-CM | POA: Diagnosis not present

## 2021-10-27 DIAGNOSIS — J384 Edema of larynx: Secondary | ICD-10-CM | POA: Diagnosis not present

## 2021-10-27 DIAGNOSIS — Z08 Encounter for follow-up examination after completed treatment for malignant neoplasm: Secondary | ICD-10-CM | POA: Diagnosis not present

## 2021-11-09 ENCOUNTER — Encounter: Payer: Self-pay | Admitting: Diagnostic Neuroimaging

## 2021-11-09 ENCOUNTER — Ambulatory Visit: Payer: Medicare HMO | Admitting: Diagnostic Neuroimaging

## 2021-11-09 VITALS — BP 95/62 | HR 64 | Ht 70.0 in | Wt 178.0 lb

## 2021-11-09 DIAGNOSIS — G20C Parkinsonism, unspecified: Secondary | ICD-10-CM | POA: Diagnosis not present

## 2021-11-09 DIAGNOSIS — G250 Essential tremor: Secondary | ICD-10-CM | POA: Diagnosis not present

## 2021-11-09 MED ORDER — CARBIDOPA-LEVODOPA 25-100 MG PO TABS: 1.0000 | ORAL_TABLET | Freq: Three times a day (TID) | ORAL | 4 refills | Status: DC

## 2021-11-09 NOTE — Patient Instructions (Signed)
  ESSENTIAL TREMOR (+ subtle parkinsonism)  - INCREASE carb/levo  to 1 tab twice a day (for 2 weeks; the 1 tab three times a day for 2 weeks)  - may consider gabapentin, topiramate, levetiracetam in future for better tremor control; may consider referral for deep brain stimulation as well.
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
## 2022-03-03 ENCOUNTER — Other Ambulatory Visit: Payer: Medicare HMO

## 2022-06-01 ENCOUNTER — Emergency Department (HOSPITAL_COMMUNITY)
Admission: EM | Admit: 2022-06-01 | Discharge: 2022-06-02 | Disposition: A | Discharge status: Home / Self Care | Payer: Medicare HMO | Attending: Emergency Medicine | Admitting: Emergency Medicine

## 2022-06-01 ENCOUNTER — Other Ambulatory Visit: Payer: Self-pay

## 2022-06-01 DIAGNOSIS — M25532 Pain in left wrist: Secondary | ICD-10-CM | POA: Diagnosis not present

## 2022-06-01 DIAGNOSIS — W19XXXA Unspecified fall, initial encounter: Secondary | ICD-10-CM | POA: Diagnosis not present

## 2022-06-01 DIAGNOSIS — M542 Cervicalgia: Secondary | ICD-10-CM | POA: Diagnosis not present

## 2022-06-01 DIAGNOSIS — S52502A Unspecified fracture of the lower end of left radius, initial encounter for closed fracture: Secondary | ICD-10-CM | POA: Diagnosis not present

## 2022-06-01 DIAGNOSIS — S61411A Laceration without foreign body of right hand, initial encounter: Secondary | ICD-10-CM | POA: Diagnosis not present

## 2022-06-01 DIAGNOSIS — R109 Unspecified abdominal pain: Secondary | ICD-10-CM | POA: Diagnosis not present

## 2022-06-01 DIAGNOSIS — R079 Chest pain, unspecified: Secondary | ICD-10-CM | POA: Insufficient documentation

## 2022-06-01 DIAGNOSIS — Z79899 Other long term (current) drug therapy: Secondary | ICD-10-CM | POA: Diagnosis not present

## 2022-06-01 DIAGNOSIS — M549 Dorsalgia, unspecified: Secondary | ICD-10-CM | POA: Insufficient documentation

## 2022-06-01 DIAGNOSIS — I2510 Atherosclerotic heart disease of native coronary artery without angina pectoris: Secondary | ICD-10-CM | POA: Diagnosis not present

## 2022-06-01 DIAGNOSIS — W01198A Fall on same level from slipping, tripping and stumbling with subsequent striking against other object, initial encounter: Secondary | ICD-10-CM | POA: Diagnosis not present

## 2022-06-01 DIAGNOSIS — M7989 Other specified soft tissue disorders: Secondary | ICD-10-CM | POA: Diagnosis not present

## 2022-06-01 DIAGNOSIS — S0240CA Maxillary fracture, right side, initial encounter for closed fracture: Secondary | ICD-10-CM | POA: Diagnosis not present

## 2022-06-01 DIAGNOSIS — Z7982 Long term (current) use of aspirin: Secondary | ICD-10-CM | POA: Diagnosis not present

## 2022-06-01 DIAGNOSIS — S02401A Maxillary fracture, unspecified, initial encounter for closed fracture: Secondary | ICD-10-CM

## 2022-06-01 DIAGNOSIS — S199XXA Unspecified injury of neck, initial encounter: Secondary | ICD-10-CM | POA: Diagnosis not present

## 2022-06-01 DIAGNOSIS — I10 Essential (primary) hypertension: Secondary | ICD-10-CM | POA: Insufficient documentation

## 2022-06-01 DIAGNOSIS — M79641 Pain in right hand: Secondary | ICD-10-CM | POA: Diagnosis present

## 2022-06-01 DIAGNOSIS — R58 Hemorrhage, not elsewhere classified: Secondary | ICD-10-CM | POA: Diagnosis not present

## 2022-06-01 DIAGNOSIS — Z23 Encounter for immunization: Secondary | ICD-10-CM | POA: Diagnosis not present

## 2022-06-01 DIAGNOSIS — Z043 Encounter for examination and observation following other accident: Secondary | ICD-10-CM | POA: Diagnosis not present

## 2022-06-01 DIAGNOSIS — Y92520 Airport as the place of occurrence of the external cause: Secondary | ICD-10-CM | POA: Insufficient documentation

## 2022-06-01 DIAGNOSIS — S0181XA Laceration without foreign body of other part of head, initial encounter: Secondary | ICD-10-CM | POA: Insufficient documentation

## 2022-06-01 DIAGNOSIS — S3993XA Unspecified injury of pelvis, initial encounter: Secondary | ICD-10-CM | POA: Diagnosis not present

## 2022-06-01 DIAGNOSIS — Z7902 Long term (current) use of antithrombotics/antiplatelets: Secondary | ICD-10-CM | POA: Diagnosis not present

## 2022-06-01 DIAGNOSIS — S6992XA Unspecified injury of left wrist, hand and finger(s), initial encounter: Secondary | ICD-10-CM | POA: Diagnosis not present

## 2022-06-01 DIAGNOSIS — M25539 Pain in unspecified wrist: Secondary | ICD-10-CM | POA: Diagnosis not present

## 2022-06-01 DIAGNOSIS — S0990XA Unspecified injury of head, initial encounter: Secondary | ICD-10-CM | POA: Diagnosis not present
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
## 2022-06-02 ENCOUNTER — Emergency Department (HOSPITAL_COMMUNITY): Payer: Medicare HMO

## 2022-06-02 DIAGNOSIS — M7989 Other specified soft tissue disorders: Secondary | ICD-10-CM | POA: Diagnosis not present

## 2022-06-02 DIAGNOSIS — Z043 Encounter for examination and observation following other accident: Secondary | ICD-10-CM | POA: Diagnosis not present

## 2022-06-02 DIAGNOSIS — S3993XA Unspecified injury of pelvis, initial encounter: Secondary | ICD-10-CM | POA: Diagnosis not present

## 2022-06-02 DIAGNOSIS — M25532 Pain in left wrist: Secondary | ICD-10-CM | POA: Diagnosis not present

## 2022-06-02 DIAGNOSIS — S0990XA Unspecified injury of head, initial encounter: Secondary | ICD-10-CM | POA: Diagnosis not present

## 2022-06-02 DIAGNOSIS — S199XXA Unspecified injury of neck, initial encounter: Secondary | ICD-10-CM | POA: Diagnosis not present

## 2022-06-02 LAB — I-STAT CHEM 8, ED
BUN: 15 mg/dL (ref 8–23)
Calcium, Ion: 1.02 mmol/L — ABNORMAL LOW (ref 1.15–1.40)
Chloride: 106 mmol/L (ref 98–111)
Creatinine, Ser: 0.8 mg/dL (ref 0.61–1.24)
Glucose, Bld: 102 mg/dL — ABNORMAL HIGH (ref 70–99)
HCT: 42 % (ref 39.0–52.0)
Hemoglobin: 14.3 g/dL (ref 13.0–17.0)
Potassium: 3.9 mmol/L (ref 3.5–5.1)
Sodium: 141 mmol/L (ref 135–145)
TCO2: 24 mmol/L (ref 22–32)

## 2022-06-02 MED ORDER — ACETAMINOPHEN 325 MG PO TABS
650.0000 mg | ORAL_TABLET | Freq: Once | ORAL | Status: AC
  Administered 2022-06-02: 650 mg via ORAL
  Filled 2022-06-02: qty 2

## 2022-06-02 MED ORDER — AMOXICILLIN-POT CLAVULANATE 875-125 MG PO TABS: 1.0000 | ORAL_TABLET | Freq: Two times a day (BID) | ORAL | 0 refills | Status: AC

## 2022-06-02 MED ORDER — TETANUS-DIPHTH-ACELL PERTUSSIS 5-2.5-18.5 LF-MCG/0.5 IM SUSY
0.5000 mL | PREFILLED_SYRINGE | Freq: Once | INTRAMUSCULAR | Status: AC
  Administered 2022-06-02: 0.5 mL via INTRAMUSCULAR
  Filled 2022-06-02: qty 0.5

## 2022-06-02 MED ORDER — OXYCODONE-ACETAMINOPHEN 5-325 MG PO TABS: 1.0000 | ORAL_TABLET | Freq: Three times a day (TID) | ORAL | 0 refills | Status: AC | PRN

## 2022-06-02 NOTE — Discharge Instructions (Signed)
You have a fracture of your left radius, placed in a splint please wear at all times, this is not waterproof do not get wet.  I recommend keeping elevated will not use use over-the-counter pain medication as needed.  Please follow-up with orthopedics. You have a right sinus fracture-I have started antibiotics this will help decrease risk of infection, please try not to sneeze or blow your nose as this could increase risk of nosebleed.  Please follow-up with Dr. Jenne Pane for further evaluation  I have given you a short course of narcotics please take as prescribed.  This medication can make you drowsy do not consume alcohol or operate heavy machinery when taking this medication.  This medication is Tylenol in it do not take Tylenol and take this medication.  Come back to the emergency department if you develop chest pain, shortness of breath, severe abdominal pain, uncontrolled nausea, vomiting, diarrhea.

## 2022-06-02 NOTE — ED Provider Notes (Signed)
[place] EMERGENCY DEPARTMENT AT [facility] Provider Note   CSN: [id number] Arrival date & time: 06/01/22  2341     History  Chief Complaint  Patient presents with   Fall / Level 2 Plavix    Ruben Conrad is a 82 y.o. male.  HPI   Patient with medical history including hypertension, MI, CAD, presenting of is a level 2 trauma fall while on antiplatelets.  Patient states that he was coming home from [place], was walking through the airport tripped causing him to fall onto his right side, states he broke his fall with his right arm, he states he did hit his head but denies loss of conscious.  States his biggest complaint is pain in his right hand from the cuts, he denies any headaches change in vision paresthesias or weakness the upper lower extremities, and understanding neck pain back pain chest pain abdominal pain, states he was able to ambulate.  He has no other complaints.     Home Medications Prior to Admission medications   Medication Sig Start Date End Date Taking? Authorizing Provider  amoxicillin-clavulanate (AUGMENTIN) 875-125 MG tablet Take 1 tablet by mouth every 12 (twelve) hours for 7 days. 06/02/22 06/09/22 Yes Carroll Sage, PA-C  oxyCODONE-acetaminophen (PERCOCET/ROXICET) 5-325 MG tablet Take 1 tablet by mouth every 8 (eight) hours as needed for up to 3 days for severe pain. 06/02/22 06/05/22 Yes Carroll Sage, PA-C  aspirin 325 MG tablet Take 325 mg by mouth daily.    [provider]  atenolol (TENORMIN) 50 MG tablet Take 50 mg by mouth daily. 09/25/17   [provider]  atorvastatin (LIPITOR) 20 MG tablet Take 20 mg by mouth daily.  09/25/17   [provider]  Calcium Carbonate-Vit D-Min (CALTRATE 600+D PLUS MINERALS) 600-800 MG-UNIT CHEW Chew 1 each by mouth daily.     [provider]  carbidopa-levodopa (SINEMET IR) 25-100 MG tablet Take 1 tablet by mouth 3 (three) times daily. 11/09/21 02/02/23  Penumalli,  Glenford Bayley, MD  clopidogrel (PLAVIX) 75 MG tablet Take 75 mg by mouth daily.  11/22/17   [provider]  docusate sodium (COLACE) 100 MG capsule Take 200 mg by mouth daily.     [provider]  famotidine (PEPCID) 20 MG tablet Take 20 mg by mouth daily.    [provider]  Multiple Vitamins-Minerals (MULTIVITAMIN WITH MINERALS) tablet Take 1 tablet by mouth daily.    [provider]      Allergies    Primidone and Propranolol    Review of Systems   Review of Systems  Constitutional:  Negative for chills and fever.  Respiratory:  Negative for shortness of breath.   Cardiovascular:  Negative for chest pain.  Gastrointestinal:  Negative for abdominal pain.  Skin:  Positive for wound.  Neurological:  Negative for headaches.    Physical Exam Updated Vital Signs BP 120/66   Pulse (!) 56   Resp 14   Ht 5\' 11"  (1.803 m)   Wt 81.2 kg   SpO2 97%   BMI 24.97 kg/m  Physical Exam Vitals and nursing note reviewed.  Constitutional:      General: He is not in acute distress.    Appearance: He is not ill-appearing.  HENT:     Head: Normocephalic and atraumatic.     Comments: Small laceration above the right eyebrow, no raccoon eyes or Battle sign noted.    Nose: No congestion.     Comments: Both  nares are patent open    Mouth/Throat:     Mouth: Mucous membranes are moist.     Pharynx: Oropharynx is clear. No oropharyngeal exudate or posterior oropharyngeal erythema.     Comments: No trismus no torticollis no oral trauma Eyes:     Extraocular Movements: Extraocular movements intact.     Conjunctiva/sclera: Conjunctivae normal.     Pupils: Pupils are equal, round, and reactive to light.  Cardiovascular:     Rate and Rhythm: Normal rate and regular rhythm.     Pulses: Normal pulses.     Heart sounds: No murmur heard.    No friction rub. No gallop.  Pulmonary:     Effort: No respiratory distress.     Breath sounds: No wheezing, rhonchi or rales.      Comments: No obvious trauma of the chest, chest was nontender, lung sounds clear bilaterally Abdominal:     Palpations: Abdomen is soft.     Tenderness: There is no abdominal tenderness. There is no right CVA tenderness or left CVA tenderness.     Comments: No obvious trauma of the abdomen, abdomen soft nontender  Musculoskeletal:     Comments: Spine was palpated was nontender to palpation no step-off deformities noted, no pelvis instability no leg shortening.  Patient is moving his upper and lower extremities no difficulty, patient has noted skin tears on his right hand, superficial, hemodynamically stable,  Skin:    General: Skin is warm and dry.  Neurological:     Mental Status: He is alert.     Comments: No facial asymmetry no difficulty with word finding following two-step commands no unilateral weakness present.  Psychiatric:        Mood and Affect: Mood normal.     ED Results / Procedures / Treatments   Labs (all labs ordered are listed, but only abnormal results are displayed) Labs Reviewed  I-STAT CHEM 8, ED - Abnormal; Notable for the following components:      Result Value   Glucose, Bld 102 (*)    Calcium, Ion 1.02 (*)    All other components within normal limits    EKG None  Radiology DG Wrist Navic Only Left  Result Date: 06/02/2022 CLINICAL DATA:  Recent fall with wrist pain, initial encounter EXAM: DG WRIST LEFT COMPARISON:  None Available. FINDINGS: Cortical irregularity is noted along the distal aspect of the radius consistent with undisplaced mildly impacted fracture. No other fracture is seen. No significant soft tissue abnormality is noted. IMPRESSION: Mildly impacted radial fracture. Electronically Signed   By: Alcide Clever M.D.   On: 06/02/2022 01:29   CT HEAD WO CONTRAST  Result Date: 06/02/2022 CLINICAL DATA:  Level 2 fall on blood thinners EXAM: CT HEAD WITHOUT CONTRAST CT MAXILLOFACIAL WITHOUT CONTRAST CT CERVICAL SPINE WITHOUT CONTRAST TECHNIQUE:  Multidetector CT imaging of the head, cervical spine, and maxillofacial structures were performed using the standard protocol without intravenous contrast. Multiplanar CT image reconstructions of the cervical spine and maxillofacial structures were also generated. RADIATION DOSE REDUCTION: This exam was performed according to the departmental dose-optimization program which includes automated exposure control, adjustment of the mA and/or kV according to patient size and/or use of iterative reconstruction technique. COMPARISON:  CT head and cervical spine 05/12/2014 FINDINGS: CT HEAD FINDINGS Brain: No evidence of acute infarction, hemorrhage, hydrocephalus, extra-axial collection or mass lesion/mass effect. Generalized atrophy and mild chronic microvascular ischemic change. Vascular: No hyperdense vessel or unexpected calcification. Skull: Normal. Negative for fracture or focal lesion.  Other: None. CT MAXILLOFACIAL FINDINGS Osseous: Acute minimally displaced fracture of the inferolateral wall of the right maxillary sinus (series 9/image 42). Additional minimally displaced fractures of the lateral wall (5/51). Orbits: Negative. No traumatic or inflammatory finding. Sinuses: Complete opacification of the right maxillary sinus with frothy secretions likely blood products. Mucosal thickening in the left maxillary and sphenoid sinuses. Mastoid air cells are well aerated. Soft tissues: Small contusion about the right face. CT CERVICAL SPINE FINDINGS Alignment: No evidence of traumatic malalignment. Skull base and vertebrae: No acute fracture. No primary bone lesion or focal pathologic process. Soft tissues and spinal canal: No prevertebral fluid or swelling. No visible canal hematoma. Disc levels: Multilevel spondylosis, disc space height loss, and degenerative endplate changes which is moderate-advanced from C3-C7. Moderate facet arthropathy on the left at C2-C3 and C3-C4. Posterior disc osteophyte complexes cause mild  multilevel effacement of the ventral thecal sac. No high-grade spinal canal narrowing. Uncovertebral spurring and facet arthropathy cause advanced neural foraminal narrowing on the left at C3-C4, on the left at C4-C5, and on the left at C5-C6. Upper chest: Emphysema. Other: Carotid calcification. IMPRESSION: CT HEAD: No acute intracranial abnormality. CT MAXILLOFACIAL: 1. Acute minimally displaced fractures of the inferolateral and lateral walls of the right maxillary sinus. 2. Complete opacification of the right maxillary sinus with blood products. CT CERVICAL SPINE: No acute fracture or traumatic malalignment. Electronically Signed   By: Minerva Fester M.D.   On: 06/02/2022 00:37   CT MAXILLOFACIAL WO CONTRAST  Result Date: 06/02/2022 CLINICAL DATA:  Level 2 fall on blood thinners EXAM: CT HEAD WITHOUT CONTRAST CT MAXILLOFACIAL WITHOUT CONTRAST CT CERVICAL SPINE WITHOUT CONTRAST TECHNIQUE: Multidetector CT imaging of the head, cervical spine, and maxillofacial structures were performed using the standard protocol without intravenous contrast. Multiplanar CT image reconstructions of the cervical spine and maxillofacial structures were also generated. RADIATION DOSE REDUCTION: This exam was performed according to the departmental dose-optimization program which includes automated exposure control, adjustment of the mA and/or kV according to patient size and/or use of iterative reconstruction technique. COMPARISON:  CT head and cervical spine 05/12/2014 FINDINGS: CT HEAD FINDINGS Brain: No evidence of acute infarction, hemorrhage, hydrocephalus, extra-axial collection or mass lesion/mass effect. Generalized atrophy and mild chronic microvascular ischemic change. Vascular: No hyperdense vessel or unexpected calcification. Skull: Normal. Negative for fracture or focal lesion. Other: None. CT MAXILLOFACIAL FINDINGS Osseous: Acute minimally displaced fracture of the inferolateral wall of the right maxillary sinus  (series 9/image 42). Additional minimally displaced fractures of the lateral wall (5/51). Orbits: Negative. No traumatic or inflammatory finding. Sinuses: Complete opacification of the right maxillary sinus with frothy secretions likely blood products. Mucosal thickening in the left maxillary and sphenoid sinuses. Mastoid air cells are well aerated. Soft tissues: Small contusion about the right face. CT CERVICAL SPINE FINDINGS Alignment: No evidence of traumatic malalignment. Skull base and vertebrae: No acute fracture. No primary bone lesion or focal pathologic process. Soft tissues and spinal canal: No prevertebral fluid or swelling. No visible canal hematoma. Disc levels: Multilevel spondylosis, disc space height loss, and degenerative endplate changes which is moderate-advanced from C3-C7. Moderate facet arthropathy on the left at C2-C3 and C3-C4. Posterior disc osteophyte complexes cause mild multilevel effacement of the ventral thecal sac. No high-grade spinal canal narrowing. Uncovertebral spurring and facet arthropathy cause advanced neural foraminal narrowing on the left at C3-C4, on the left at C4-C5, and on the left at C5-C6. Upper chest: Emphysema. Other: Carotid calcification. IMPRESSION: CT HEAD: No acute intracranial  abnormality. CT MAXILLOFACIAL: 1. Acute minimally displaced fractures of the inferolateral and lateral walls of the right maxillary sinus. 2. Complete opacification of the right maxillary sinus with blood products. CT CERVICAL SPINE: No acute fracture or traumatic malalignment. Electronically Signed   By: Minerva Fester M.D.   On: 06/02/2022 00:37   CT CERVICAL SPINE WO CONTRAST  Result Date: 06/02/2022 CLINICAL DATA:  Level 2 fall on blood thinners EXAM: CT HEAD WITHOUT CONTRAST CT MAXILLOFACIAL WITHOUT CONTRAST CT CERVICAL SPINE WITHOUT CONTRAST TECHNIQUE: Multidetector CT imaging of the head, cervical spine, and maxillofacial structures were performed using the standard protocol  without intravenous contrast. Multiplanar CT image reconstructions of the cervical spine and maxillofacial structures were also generated. RADIATION DOSE REDUCTION: This exam was performed according to the departmental dose-optimization program which includes automated exposure control, adjustment of the mA and/or kV according to patient size and/or use of iterative reconstruction technique. COMPARISON:  CT head and cervical spine 05/12/2014 FINDINGS: CT HEAD FINDINGS Brain: No evidence of acute infarction, hemorrhage, hydrocephalus, extra-axial collection or mass lesion/mass effect. Generalized atrophy and mild chronic microvascular ischemic change. Vascular: No hyperdense vessel or unexpected calcification. Skull: Normal. Negative for fracture or focal lesion. Other: None. CT MAXILLOFACIAL FINDINGS Osseous: Acute minimally displaced fracture of the inferolateral wall of the right maxillary sinus (series 9/image 42). Additional minimally displaced fractures of the lateral wall (5/51). Orbits: Negative. No traumatic or inflammatory finding. Sinuses: Complete opacification of the right maxillary sinus with frothy secretions likely blood products. Mucosal thickening in the left maxillary and sphenoid sinuses. Mastoid air cells are well aerated. Soft tissues: Small contusion about the right face. CT CERVICAL SPINE FINDINGS Alignment: No evidence of traumatic malalignment. Skull base and vertebrae: No acute fracture. No primary bone lesion or focal pathologic process. Soft tissues and spinal canal: No prevertebral fluid or swelling. No visible canal hematoma. Disc levels: Multilevel spondylosis, disc space height loss, and degenerative endplate changes which is moderate-advanced from C3-C7. Moderate facet arthropathy on the left at C2-C3 and C3-C4. Posterior disc osteophyte complexes cause mild multilevel effacement of the ventral thecal sac. No high-grade spinal canal narrowing. Uncovertebral spurring and facet  arthropathy cause advanced neural foraminal narrowing on the left at C3-C4, on the left at C4-C5, and on the left at C5-C6. Upper chest: Emphysema. Other: Carotid calcification. IMPRESSION: CT HEAD: No acute intracranial abnormality. CT MAXILLOFACIAL: 1. Acute minimally displaced fractures of the inferolateral and lateral walls of the right maxillary sinus. 2. Complete opacification of the right maxillary sinus with blood products. CT CERVICAL SPINE: No acute fracture or traumatic malalignment. Electronically Signed   By: Minerva Fester M.D.   On: 06/02/2022 00:37   DG Pelvis Portable  Result Date: 06/02/2022 CLINICAL DATA:  Status post fall. EXAM: PORTABLE PELVIS 1-2 VIEWS COMPARISON:  None Available. FINDINGS: There is no evidence of an acute pelvic fracture or diastasis. No pelvic bone lesions are seen. A radiopaque intramedullary rod and compression screw device are seen within the proximal left femur. A chronic proximal left femoral fracture is also noted. IMPRESSION: 1. No acute pelvic fracture or diastasis. 2. Prior open reduction internal fixation of the proximal left femur. Electronically Signed   By: Aram Candela M.D.   On: 06/02/2022 00:12   DG Wrist 2 Views Right  Result Date: 06/02/2022 CLINICAL DATA:  Status post fall. EXAM: RIGHT WRIST - 2 VIEW COMPARISON:  None Available. FINDINGS: There is no evidence of fracture or dislocation. There is no evidence of arthropathy or other  focal bone abnormality. Mild soft tissue swelling is seen along the lateral aspect of the fifth right metacarpal phalangeal joint. IMPRESSION: Mild soft tissue swelling without evidence of acute fracture or dislocation. Electronically Signed   By: Aram Candela M.D.   On: 06/02/2022 00:11   DG Hand 2 View Right  Result Date: 06/02/2022 CLINICAL DATA:  Status post trauma. EXAM: RIGHT HAND - 2 VIEW COMPARISON:  None Available. FINDINGS: There is no evidence of an acute fracture or dislocation. There is no  evidence of significant arthropathy or other focal bone abnormality. Mild lateral soft tissue swelling is seen adjacent to the fifth metacarpophalangeal joint. IMPRESSION: Mild lateral soft tissue swelling without evidence of acute fracture or dislocation. Electronically Signed   By: Aram Candela M.D.   On: 06/02/2022 00:09    Procedures .Splint Application  Date/Time: 06/02/2022 2:27 AM  Performed by: Carroll Sage, PA-C Authorized by: Carroll Sage, PA-C   Consent:    Consent obtained:  Verbal   Consent given by:  Patient   Risks, benefits, and alternatives were discussed: yes     Risks discussed:  Discoloration, numbness, pain and swelling   Alternatives discussed:  No treatment, delayed treatment, alternative treatment, observation and referral Universal protocol:    Patient identity confirmed:  Verbally with patient Pre-procedure details:    Distal neurologic exam:  Normal   Distal perfusion: distal pulses strong   Procedure details:    Location:  Wrist   Wrist location:  L wrist   Splint type:  Sugar tong Post-procedure details:    Distal neurologic exam:  Normal   Distal perfusion: distal pulses strong     Procedure completion:  Tolerated well, no immediate complications     Medications Ordered in ED Medications  Tdap (BOOSTRIX) injection 0.5 mL (0.5 mLs Intramuscular Given 06/02/22 0017)  acetaminophen (TYLENOL) tablet 650 mg (650 mg Oral Given 06/02/22 0145)    ED Course/ Medical Decision Making/ A&P                             Medical Decision Making Amount and/or Complexity of Data Reviewed Radiology: ordered.  Risk OTC drugs. Prescription drug management.   This patient presents to the ED for concern of fall, this involves an extensive number of treatment options, and is a complaint that carries with it a high risk of complications and morbidity.  The differential diagnosis includes intracranial bleed, thoracic/abdominal trauma, orthopedic  injury    Additional history obtained:  Additional history obtained from daughter at bedside External records from outside source obtained and reviewed including Neurology notes   Co morbidities that complicate the patient evaluation  On antiplatelets  Social Determinants of Health:  N/A    Lab Tests:  I Ordered, and personally interpreted labs.  The pertinent results include: I-STAT unremarkable   Imaging Studies ordered:  I ordered imaging studies including CT head, maxillofacial, C-spine, plain film of the right wrist and hand, portable pelvis I independently visualized and interpreted imaging which left wrist reveals distal radius fracture, CT maxillofacial reveals fracture of the right maxillary sinus I agree with the radiologist interpretation   Cardiac Monitoring:  The patient was maintained on a cardiac monitor.  I personally viewed and interpreted the cardiac monitored which showed an underlying rhythm of: n/a   Medicines ordered and prescription drug management:  I ordered medication including n/a I have reviewed the patients home medicines and have made adjustments  as needed  Critical Interventions:  N/a   Reevaluation:  Presents after a fall, will obtain imaging for further evaluation.  Update patient fracture of the right sinus as well as left radius fracture, will place in splint, tolerated well.  Reassessed the patient, resting comfortably, he is able to stand without difficulty, agreement discharge at this time.  Consultations Obtained:  N/a    Test Considered:  CT chest abdomen pelvis-defers as my suspicion for thoracic/abdominal trauma is low at this time no significant trauma on my exam, both which are nontender to palpation.    Rule out low suspicion for intracranial head bleed as patient denies loss of conscious, is not on anticoagulant, no focal deficits present on my exam ct head is negative.  Low suspicion for spinal cord  abnormality or spinal fracture spine was palpated was nontender to palpation, patient has full range of motion in the upper and lower extremities ct c spine is negative.  Low suspicion for pneumothorax as lung sounds are clear bilaterally.  Patient had skin tears noted on his right hand and right eyebrow, they were superficial, well-approximated, with clean borders, will defer suturing at this time as likely this this will cause worsening skin tearing.  Steri-Strips were applied to wounds on his hands.   Dispostion and problem list  After consideration of the diagnostic results and the patients response to treatment, I feel that the patent would benefit from discharge.  Sinus fracture-placed on Augmentin to help prevent sinusitis, will refer to ENT for further evaluation Left distal radius fracture-placed in sugar-tong follow-up with orthopedics for further evaluation            Final Clinical Impression(s) / ED Diagnoses Final diagnoses:  Closed fracture of maxillary sinus, initial encounter (HCC)  Closed fracture of distal end of left radius, unspecified fracture morphology, initial encounter    Rx / DC Orders ED Discharge Orders          Ordered    oxyCODONE-acetaminophen (PERCOCET/ROXICET) 5-325 MG tablet  Every 8 hours PRN        06/02/22 0200    amoxicillin-clavulanate (AUGMENTIN) 875-125 MG tablet  Every 12 hours        06/02/22 0201              Carroll Sage, PA-C 06/02/22 0230    Gilda Crease, MD 06/05/22 0500

## 2022-06-02 NOTE — Progress Notes (Signed)
Orthopedic Tech Progress Note Patient Details:  Ruben Conrad [birth year] [id number]  Ortho Devices Type of Ortho Device: Sugartong splint Ortho Device/Splint Location: LUE Ortho Device/Splint Interventions: Ordered, Application, Adjustment   Post Interventions Patient Tolerated: Well  [name] 06/02/2022, 2:21 AM

## 2022-06-02 NOTE — ED Notes (Signed)
Trauma Response Nurse Documentation   Ruben Conrad is a 82 y.o. male arriving to [facility] ED via EMS  On clopidogrel 75 mg daily. Trauma was activated as a Level 2 by ED charge RN based on the following trauma criteria Elderly patients > 65 with head trauma on anti-coagulation (excluding ASA).  Patient cleared for CT by Dr. Blinda Leatherwood EDP. Pt transported to CT with trauma response nurse present to monitor. RN remained with the patient throughout their absence from the department for clinical observation.   GCS 15.  History   Past Medical History:  Diagnosis Date   Cancer [facility])    head/neck   Coronary artery disease    with stents   History of radiation therapy 04/15/18- 05/22/18   Head and neck/ Larynx 28 fractions. 2.25 Gy each for total of 63 Gy.    Hypercholesteremia    Hypertension    Myocardial infarction (HCC)    Tremors of nervous system      Past Surgical History:  Procedure Laterality Date   APPENDECTOMY     CORONARY ANGIOPLASTY WITH STENT PLACEMENT     DIRECT LARYNGOSCOPY N/A 02/25/2018   Procedure: SUSPENDED DIRECT MICROLARYNGOSCOPY WITH CO2 LASER VOCAL CORD BIOPSY;  Surgeon: Christia Reading, MD;  Location: [facility] OR;  Service: ENT;  Laterality: N/A;   HIP FRACTURE SURGERY Left 2018   SKIN SURGERY  10/2021       Initial Focused Assessment (If applicable, or please see trauma documentation): Alert/oriented male [place] the [place] after a mechanical fall, skin tears to right hand and right wrist, laceration right eyebrow. Complains of right wrist pain and neck pain.  Airway patent/unobstructed, BS clear No obvious uncontrolled hemorrhage, skin tears with right hand/wrist with controlled bleeding, laceration right wrist with controlled bleeding GCS 15 PERRLA 3  CT's Completed:   CT Head, CT Maxillofacial, and CT C-Spine   Interventions:  IV start and trauma lab draw CT head, c-spine, maxface Portable pelvis, right hand and right wrist  XRAY TDAP Wound care/lac repair Family updates  Plan for disposition:  d/c home  Consults completed:  none at the time of this note.  Event Summary: Presents via EMS from airport after a fall, states tripped on his feet. Skin tears to right hand and wrist with pain, laceration right brow and neck pain. Alert x4, denies LOC. Applied nonadherent dressings and wrapped, bleeding controlled. Bedside XRAYS completed, then escorted to CT. Anticipate D/C home. Family at bedside.   Bedside handoff with ED RN Reita Cliche.    Kyler Germer O Bernie Ransford  Trauma Response RN  Please call TRN at [phone] for further assistance.  a

## 2022-06-02 NOTE — ED Notes (Signed)
Patient transported to CT scan . 

## 2022-06-06 DIAGNOSIS — S52552A Other extraarticular fracture of lower end of left radius, initial encounter for closed fracture: Secondary | ICD-10-CM | POA: Diagnosis not present

## 2022-06-12 DIAGNOSIS — S52552A Other extraarticular fracture of lower end of left radius, initial encounter for closed fracture: Secondary | ICD-10-CM | POA: Diagnosis not present

## 2022-06-20 DIAGNOSIS — Z8521 Personal history of malignant neoplasm of larynx: Secondary | ICD-10-CM | POA: Diagnosis not present

## 2022-06-20 DIAGNOSIS — S52552A Other extraarticular fracture of lower end of left radius, initial encounter for closed fracture: Secondary | ICD-10-CM | POA: Diagnosis not present

## 2022-06-20 DIAGNOSIS — S02401D Maxillary fracture, unspecified, subsequent encounter for fracture with routine healing: Secondary | ICD-10-CM | POA: Diagnosis not present

## 2022-06-29 ENCOUNTER — Other Ambulatory Visit: Payer: Medicare HMO

## 2022-07-04 NOTE — Progress Notes (Signed)
Ruben Conrad presents for follow up after completing radiation to the larynx on 05/22/2018.   Pain issues, if any: Denies any mouth or throat pain, or any lingering pain from right maxillary fracture from recent fall.  Using a feeding tube?: N/A Weight changes, if any: Reports a healthy appetite Wt Readings from Last 3 Encounters:  07/18/22 171 lb 6.4 oz (77.7 kg)  06/02/22 179 lb (81.2 kg)  11/09/21 178 lb (80.7 kg)   Swallowing issues, if any: Denies--reports he is able to eat/drink a wide variety Smoking or chewing tobacco? None Using fluoride toothpaste daily? N/A--full set of dentures Last ENT visit was on:  06-20-22 with Dr. Ernestene Kiel The [place] airport [place], resulting in an right maxillary fracture. Given rx for abx. He was scheduled to consult with Dr. Salomon Fick, however, the appointment was cancelled and subsequently rescheduled. The patient denies experiencing any facial numbness or pain. Supplemental Information He has been going to orthopedics and has wrist fractures as well. He quit smoking 15 years ago. Follows with Dr. Salomon Fick for hx of larynx cancer (RT 05/22/2018). MEDICAL DECISION MAKING IMPRESSION: Non-surgical minimally displaced max sinus fracture. Doing well. Hx of larynx cancer s/p RT 05/22/2018.  1. Closed fracture of maxillary sinus with routine healing, subsequent encounter  2. History of cancer of larynx  --RECOMMENDATIONS: Reassurance provided , no intervention indicated F/u with Dr. Jenne Pane for routine larynx cancer surveillance.   10-27-21 with Dr. Jenne Pane  Procedure: Transnasal fiberoptic laryngoscopy  Informed consent was obtainted including a discussion of risks, benefits, and alternatives. The patient was placed in a sitting position and the nasal passages sprayed with a combination of Afrin and Xyolcaine. The fiberoptic laryngoscope was then placed through the nasal passage to view the pharynx and larynx. After completion, the  telescope was removed. Findings included normal nasal passages, no mass or abnormality in the nasopharynx, and no mass or ulceration in the pharynx or larynx. Pyriform sinuses are open. Secretions are minimal. Vocal folds are without mass, scarring, or ulceration. The vocal folds adduct and abduct symmetrically. There is good glottal closure. Muscle tension patterns are not present. Laryngeal edema is minimal. There is patchy redness in the larynx.  Impression & Plans:  Ruben Conrad is a 82 y.o. male with history of glottic cancer. - There is no evidence of recurrence of laryngeal cancer. He was reassured. He will follow-up with Dr. Basilio Cairo next summer and can follow-up with me in one year.   Other notable issues, if any: Overall reports he's doing well and denies any new concerns. Daughter reports he recently stopped sinemet prescription due to prolonged headaches, lethargy, decreased appetite, and low BP (reports all have resolved or improved since stopping medication)

## 2022-07-10 DIAGNOSIS — S52552A Other extraarticular fracture of lower end of left radius, initial encounter for closed fracture: Secondary | ICD-10-CM | POA: Diagnosis not present

## 2022-07-18 ENCOUNTER — Ambulatory Visit
Admission: RE | Admit: 2022-07-18 | Discharge: 2022-07-18 | Disposition: A | Discharge status: Home / Self Care | Payer: Medicare HMO | Source: Ambulatory Visit | Attending: Radiation Oncology | Admitting: Radiation Oncology

## 2022-07-18 ENCOUNTER — Other Ambulatory Visit: Payer: Self-pay

## 2022-07-18 VITALS — BP 127/70 | HR 55 | Temp 97.8°F | Resp 20 | Ht 71.0 in | Wt 171.4 lb

## 2022-07-18 DIAGNOSIS — Z1329 Encounter for screening for other suspected endocrine disorder: Secondary | ICD-10-CM | POA: Insufficient documentation

## 2022-07-18 DIAGNOSIS — Z85818 Personal history of malignant neoplasm of other sites of lip, oral cavity, and pharynx: Secondary | ICD-10-CM | POA: Diagnosis not present

## 2022-07-18 DIAGNOSIS — Z923 Personal history of irradiation: Secondary | ICD-10-CM | POA: Diagnosis not present

## 2022-07-18 DIAGNOSIS — C320 Malignant neoplasm of glottis: Secondary | ICD-10-CM | POA: Diagnosis not present

## 2022-07-18 DIAGNOSIS — H6191 Disorder of right external ear, unspecified: Secondary | ICD-10-CM | POA: Insufficient documentation

## 2022-07-18 MED ORDER — OXYMETAZOLINE HCL 0.05 % NA SOLN
2.0000 | Freq: Once | NASAL | Status: AC
  Administered 2022-07-18: 2 via NASAL
  Filled 2022-07-18: qty 30

## 2022-07-18 NOTE — Progress Notes (Signed)
Radiation Oncology         [phone] ________________________________  Name: Ruben Conrad MRN: [id number]  Date: 07/18/2022  DOB: 1940-09-13  Follow-Up Note in person, outpatient  CC: Elias Else, MD (Inactive)  Christia Reading, MD  Diagnosis and Prior Radiotherapy:       ICD-10-CM   1. Malignant neoplasm of glottis (HCC)  C32.0 oxymetazoline (AFRIN) 0.05 % nasal spray 2 spray    Fiberoptic laryngoscopy    2. Skin lesion of right ear  H61.91     3. Screening for hypothyroidism  Z13.29 TSH         Cancer Staging  Malignant neoplasm of glottis [facility]) Staging form: Larynx - Glottis, AJCC 8th Edition - Clinical stage from 03/26/2018: Stage I (cT1b, cN0, cM0) - Signed by Lonie Peak, MD on 03/26/2018   Radiation Treatment Dates: 04/15/2018 through 05/22/2018 Site Technique Total Dose Dose per Fx Completed Fx Beam Energies  Head & neck: HN_larynx 3D 63/63 2.25 28/28 6X   CHIEF COMPLAINT:  here for checkup  NARRATIVE:  Ruben Conrad presents for follow up after completing radiation to the larynx on 05/22/2018.  Pain issues, if any: Denies any mouth or throat pain, or any lingering pain from right maxillary fracture from recent fall.  Using a feeding tube?: N/A Weight changes, if any: Reports a healthy appetite    Wt Readings from Last 3 Encounters:  07/18/22 171 lb 6.4 oz (77.7 kg)  06/02/22 179 lb (81.2 kg)  11/09/21 178 lb (80.7 kg)    Swallowing issues, if any: Denies--reports he is able to eat/drink a wide variety Smoking or chewing tobacco? None Using fluoride toothpaste daily? N/A--full set of dentures Last ENT visit was on:  06-20-22 with Dr. Ernestene Kiel The patient [place] a [place] at the airport on [place]20[place], resulting in an right maxillary fracture. Given rx for abx. He was scheduled to consult with Dr. Salomon Fick, however, the appointment was cancelled and subsequently rescheduled. The patient denies experiencing any facial numbness or pain. Supplemental  Information He has been going to orthopedics and has wrist fractures as well. He quit smoking 15 years ago. Follows with Dr. Salomon Fick for hx of larynx cancer (RT 05/22/2018). MEDICAL DECISION MAKING IMPRESSION: Non-surgical minimally displaced max sinus fracture. Doing well. Hx of larynx cancer s/p RT 05/22/2018.  1. Closed fracture of maxillary sinus with routine healing, subsequent encounter  2. History of cancer of larynx  --RECOMMENDATIONS: Reassurance provided , no intervention indicated F/u with Dr. Jenne Pane for routine larynx cancer surveillance.    10-27-21 with Dr. Jenne Pane  Procedure: Transnasal fiberoptic laryngoscopy  Informed consent was obtainted including a discussion of risks, benefits, and alternatives. The patient was placed in a sitting position and the nasal passages sprayed with a combination of Afrin and Xyolcaine. The fiberoptic laryngoscope was then placed through the nasal passage to view the pharynx and larynx. After completion, the telescope was removed. Findings included normal nasal passages, no mass or abnormality in the nasopharynx, and no mass or ulceration in the pharynx or larynx. Pyriform sinuses are open. Secretions are minimal. Vocal folds are without mass, scarring, or ulceration. The vocal folds adduct and abduct symmetrically. There is good glottal closure. Muscle tension patterns are not present. Laryngeal edema is minimal. There is patchy redness in the larynx.   Impression & Plans:  Ruben Conrad is a 82 y.o. male with history of glottic cancer. - There is no evidence of recurrence of laryngeal cancer. He was reassured. He will follow-up with Dr. Basilio Cairo  next summer and can follow-up with me in one year.    Other notable issues, if any: Overall reports he's doing well and denies any new concerns. Daughter reports he recently stopped sinemet prescription due to prolonged headaches, lethargy, decreased appetite, and low BP (reports all have resolved or improved  since stopping medication)  Skin lesion(s) excised by derm, prompted by our referral. Grateful to have caught early skin cancer.   ALLERGIES:  is allergic to primidone and propranolol.  Meds: Current Outpatient Medications  Medication Sig Dispense Refill   Ca Phosphate-Cholecalciferol (CALTRATE GUMMY BITES) 250-10 MG-MCG CHEW Chew 2 each by mouth daily.     cetirizine (ALL DAY ALLERGY) 10 MG tablet Take 10 mg by mouth daily.     aspirin 325 MG tablet Take 325 mg by mouth daily.     atenolol (TENORMIN) 50 MG tablet Take 50 mg by mouth daily.  3   atorvastatin (LIPITOR) 20 MG tablet Take 20 mg by mouth daily.   3   Calcium Carbonate-Vit D-Min (CALTRATE 600+D PLUS MINERALS) 600-800 MG-UNIT CHEW Chew 1 each by mouth daily.      clopidogrel (PLAVIX) 75 MG tablet Take 75 mg by mouth daily.   3   docusate sodium (COLACE) 100 MG capsule Take 200 mg by mouth daily.      famotidine (PEPCID) 20 MG tablet Take 20 mg by mouth daily.     No current facility-administered medications for this encounter.    Physical Findings:  The patient is in no acute distress. Patient is alert and oriented. Wt Readings from Last 3 Encounters:  07/18/22 171 lb 6.4 oz (77.7 kg)  06/02/22 179 lb (81.2 kg)  11/09/21 178 lb (80.7 kg)    height is 5\' 11"  (1.803 m) and weight is 171 lb 6.4 oz (77.7 kg). His temperature is 97.8 F (36.6 C). His blood pressure is 127/70 and his pulse is 55 (abnormal). His respiration is 20 and oxygen saturation is 98%. . General: Alert and oriented, in no acute distress. Neck: no masses in cervical, submental or supraclavicular neck HEENT: Dentures removed. No other lesions appreciated in the mouth or throat otherwise HEART RRR CHEST CTAB Abdominal: No tenderness or rigidity to palpation. Normoactive bowel sounds.  Skin: No concerning lesions vsiualize  PROCEDURE NOTE: After obtaining consent and anesthetizing the nasal cavity with topical decongestant (oxymetazoline), the flexible  endoscope was coated with lidocaine gel and introduced and passed through the nasal cavity.  The nasopharynx, oropharynx, larynx, and hypopharynx were then examined. No evidence of malignancy.  True cords symmetrically mobile, no nodules   Lab Findings: Lab Results  Component Value Date   WBC 9.1 02/25/2018   HGB 14.3 06/02/2022   HCT 42.0 06/02/2022   MCV 101.2 (H) 02/25/2018   PLT 246 02/25/2018    Lab Results  Component Value Date   TSH 1.781 07/18/2022    Radiographic Findings: No results found.  Impression/Plan:    1) Head and Neck Cancer Status: No evidence of disease. Doing well overall today. He is considered cured.  He is not smoking. Applauded on this.  2) Nutritional Status: No complaints Weight is stable PEG tube: None  Wt Readings from Last 3 Encounters:  07/18/22 171 lb 6.4 oz (77.7 kg)  06/02/22 179 lb (81.2 kg)  11/09/21 178 lb (80.7 kg)     3) Risk Factors: The patient has been educated about risk factors including alcohol and tobacco abuse; they understand that avoidance of alcohol and tobacco is  important to prevent recurrences as well as other cancers. He is not chewing, smoking or vaping.   4) Swallowing: Good function   5) Thyroid function: Rechecking labs today. His PCP will continue to follow his TSH levels.  Daughter aware and will confirm with PCP. Lab Results  Component Value Date   TSH 1.781 07/18/2022    6) Other:  Follow-up with Dr. Jenne Pane in 6 months. Patient is cleared from follow-up from a radiation standpoint. It was an absolute pleasure taking part in this patient's care. He knows to call back with concerns at any time.    On date of service, in total, the time spent during this encounter was 25 minutes.  Note signed after encounter date; minutes pertain to date of service, only.  _____________________________________   Joyice Faster, PA-C    Lonie Peak, MD

## 2022-07-19 ENCOUNTER — Encounter: Payer: Self-pay | Admitting: Radiation Oncology

## 2022-07-19 LAB — TSH: TSH: 1.781 u[IU]/mL (ref 0.350–4.500)

## 2022-07-19 NOTE — Progress Notes (Signed)
Oncology Nurse Navigator Documentation   Per patient's 07/18/22 post-treatment follow-up with Dr. Basilio Cairo, sent fax to [facility] ENT Scheduling with request  Ruben Conrad be contacted and scheduled for routine post-RT follow-up with Dr. Jenne Pane in 6 months.  Notification of successful fax transmission received.   [name] RN, BSN, OCN Head & Neck Oncology Nurse Navigator [facility] Phone # [phone]  Fax # [phone]

## 2022-08-09 DIAGNOSIS — L814 Other melanin hyperpigmentation: Secondary | ICD-10-CM | POA: Diagnosis not present

## 2022-08-09 DIAGNOSIS — L570 Actinic keratosis: Secondary | ICD-10-CM | POA: Diagnosis not present

## 2022-08-09 DIAGNOSIS — D225 Melanocytic nevi of trunk: Secondary | ICD-10-CM | POA: Diagnosis not present

## 2022-08-09 DIAGNOSIS — L821 Other seborrheic keratosis: Secondary | ICD-10-CM | POA: Diagnosis not present

## 2022-08-09 DIAGNOSIS — Z85828 Personal history of other malignant neoplasm of skin: Secondary | ICD-10-CM | POA: Diagnosis not present

## 2022-08-11 DIAGNOSIS — S52552A Other extraarticular fracture of lower end of left radius, initial encounter for closed fracture: Secondary | ICD-10-CM | POA: Diagnosis not present

## 2022-09-15 DIAGNOSIS — Z8521 Personal history of malignant neoplasm of larynx: Secondary | ICD-10-CM | POA: Diagnosis not present

## 2022-09-15 DIAGNOSIS — J439 Emphysema, unspecified: Secondary | ICD-10-CM | POA: Diagnosis not present

## 2022-09-15 DIAGNOSIS — I25119 Atherosclerotic heart disease of native coronary artery with unspecified angina pectoris: Secondary | ICD-10-CM | POA: Diagnosis not present

## 2022-09-15 DIAGNOSIS — Z0000 Encounter for general adult medical examination without abnormal findings: Secondary | ICD-10-CM | POA: Diagnosis not present

## 2022-09-15 DIAGNOSIS — I10 Essential (primary) hypertension: Secondary | ICD-10-CM | POA: Diagnosis not present

## 2022-09-15 DIAGNOSIS — I700 Atherosclerosis of aorta: Secondary | ICD-10-CM | POA: Diagnosis not present

## 2022-09-15 DIAGNOSIS — M810 Age-related osteoporosis without current pathological fracture: Secondary | ICD-10-CM | POA: Diagnosis not present

## 2022-09-15 DIAGNOSIS — E782 Mixed hyperlipidemia: Secondary | ICD-10-CM | POA: Diagnosis not present

## 2022-09-15 DIAGNOSIS — G250 Essential tremor: Secondary | ICD-10-CM | POA: Diagnosis not present

## 2022-11-15 ENCOUNTER — Ambulatory Visit: Payer: Medicare HMO | Admitting: Adult Health

## 2023-01-02 DIAGNOSIS — Z08 Encounter for follow-up examination after completed treatment for malignant neoplasm: Secondary | ICD-10-CM | POA: Diagnosis not present

## 2023-01-02 DIAGNOSIS — Z8521 Personal history of malignant neoplasm of larynx: Secondary | ICD-10-CM | POA: Diagnosis not present

## 2023-10-15 DIAGNOSIS — I25119 Atherosclerotic heart disease of native coronary artery with unspecified angina pectoris: Secondary | ICD-10-CM | POA: Diagnosis not present

## 2023-10-15 DIAGNOSIS — Z0000 Encounter for general adult medical examination without abnormal findings: Secondary | ICD-10-CM | POA: Diagnosis not present

## 2023-10-15 DIAGNOSIS — Z923 Personal history of irradiation: Secondary | ICD-10-CM | POA: Diagnosis not present

## 2023-10-15 DIAGNOSIS — G20C Parkinsonism, unspecified: Secondary | ICD-10-CM | POA: Diagnosis not present

## 2023-10-15 DIAGNOSIS — M810 Age-related osteoporosis without current pathological fracture: Secondary | ICD-10-CM | POA: Diagnosis not present

## 2023-10-15 DIAGNOSIS — I10 Essential (primary) hypertension: Secondary | ICD-10-CM | POA: Diagnosis not present

## 2023-10-15 DIAGNOSIS — G250 Essential tremor: Secondary | ICD-10-CM | POA: Diagnosis not present

## 2023-10-15 DIAGNOSIS — Z8521 Personal history of malignant neoplasm of larynx: Secondary | ICD-10-CM | POA: Diagnosis not present

## 2023-10-15 DIAGNOSIS — Z08 Encounter for follow-up examination after completed treatment for malignant neoplasm: Secondary | ICD-10-CM | POA: Diagnosis not present

## 2023-10-15 DIAGNOSIS — J439 Emphysema, unspecified: Secondary | ICD-10-CM | POA: Diagnosis not present

## 2023-10-15 DIAGNOSIS — Z1331 Encounter for screening for depression: Secondary | ICD-10-CM | POA: Diagnosis not present

## 2023-10-15 DIAGNOSIS — E782 Mixed hyperlipidemia: Secondary | ICD-10-CM | POA: Diagnosis not present

## 2023-10-25 DIAGNOSIS — I10 Essential (primary) hypertension: Secondary | ICD-10-CM | POA: Diagnosis not present

## 2023-11-12 ENCOUNTER — Encounter (HOSPITAL_COMMUNITY): Payer: Self-pay

## 2023-11-12 ENCOUNTER — Emergency Department (HOSPITAL_COMMUNITY)

## 2023-11-12 ENCOUNTER — Inpatient Hospital Stay (HOSPITAL_COMMUNITY)
Admission: EM | Admit: 2023-11-12 | Discharge: 2023-11-15 | DRG: 315 | Disposition: A | Discharge status: Home / Self Care | Attending: Internal Medicine | Admitting: Internal Medicine

## 2023-11-12 ENCOUNTER — Other Ambulatory Visit: Payer: Self-pay

## 2023-11-12 DIAGNOSIS — J439 Emphysema, unspecified: Secondary | ICD-10-CM | POA: Diagnosis present

## 2023-11-12 DIAGNOSIS — Z8521 Personal history of malignant neoplasm of larynx: Secondary | ICD-10-CM

## 2023-11-12 DIAGNOSIS — E872 Acidosis, unspecified: Secondary | ICD-10-CM | POA: Diagnosis present

## 2023-11-12 DIAGNOSIS — E876 Hypokalemia: Secondary | ICD-10-CM | POA: Diagnosis not present

## 2023-11-12 DIAGNOSIS — D638 Anemia in other chronic diseases classified elsewhere: Secondary | ICD-10-CM | POA: Diagnosis present

## 2023-11-12 DIAGNOSIS — Z7902 Long term (current) use of antithrombotics/antiplatelets: Secondary | ICD-10-CM

## 2023-11-12 DIAGNOSIS — I252 Old myocardial infarction: Secondary | ICD-10-CM | POA: Diagnosis not present

## 2023-11-12 DIAGNOSIS — I10 Essential (primary) hypertension: Secondary | ICD-10-CM | POA: Diagnosis present

## 2023-11-12 DIAGNOSIS — R4182 Altered mental status, unspecified: Secondary | ICD-10-CM | POA: Diagnosis not present

## 2023-11-12 DIAGNOSIS — Z955 Presence of coronary angioplasty implant and graft: Secondary | ICD-10-CM | POA: Diagnosis not present

## 2023-11-12 DIAGNOSIS — R1013 Epigastric pain: Secondary | ICD-10-CM | POA: Diagnosis present

## 2023-11-12 DIAGNOSIS — R109 Unspecified abdominal pain: Secondary | ICD-10-CM | POA: Diagnosis not present

## 2023-11-12 DIAGNOSIS — I771 Stricture of artery: Secondary | ICD-10-CM | POA: Diagnosis not present

## 2023-11-12 DIAGNOSIS — D7589 Other specified diseases of blood and blood-forming organs: Secondary | ICD-10-CM | POA: Diagnosis present

## 2023-11-12 DIAGNOSIS — Z888 Allergy status to other drugs, medicaments and biological substances status: Secondary | ICD-10-CM

## 2023-11-12 DIAGNOSIS — Z79899 Other long term (current) drug therapy: Secondary | ICD-10-CM | POA: Diagnosis not present

## 2023-11-12 DIAGNOSIS — K8020 Calculus of gallbladder without cholecystitis without obstruction: Secondary | ICD-10-CM | POA: Diagnosis not present

## 2023-11-12 DIAGNOSIS — C320 Malignant neoplasm of glottis: Secondary | ICD-10-CM | POA: Diagnosis present

## 2023-11-12 DIAGNOSIS — R509 Fever, unspecified: Secondary | ICD-10-CM | POA: Diagnosis not present

## 2023-11-12 DIAGNOSIS — I672 Cerebral atherosclerosis: Secondary | ICD-10-CM | POA: Diagnosis not present

## 2023-11-12 DIAGNOSIS — Z0389 Encounter for observation for other suspected diseases and conditions ruled out: Secondary | ICD-10-CM | POA: Diagnosis not present

## 2023-11-12 DIAGNOSIS — Z7982 Long term (current) use of aspirin: Secondary | ICD-10-CM

## 2023-11-12 DIAGNOSIS — N281 Cyst of kidney, acquired: Secondary | ICD-10-CM | POA: Diagnosis not present

## 2023-11-12 DIAGNOSIS — R531 Weakness: Secondary | ICD-10-CM | POA: Diagnosis not present

## 2023-11-12 DIAGNOSIS — I700 Atherosclerosis of aorta: Secondary | ICD-10-CM | POA: Diagnosis present

## 2023-11-12 DIAGNOSIS — I2510 Atherosclerotic heart disease of native coronary artery without angina pectoris: Secondary | ICD-10-CM | POA: Diagnosis present

## 2023-11-12 DIAGNOSIS — E8809 Other disorders of plasma-protein metabolism, not elsewhere classified: Secondary | ICD-10-CM | POA: Diagnosis present

## 2023-11-12 DIAGNOSIS — D72829 Elevated white blood cell count, unspecified: Secondary | ICD-10-CM

## 2023-11-12 DIAGNOSIS — E7800 Pure hypercholesterolemia, unspecified: Secondary | ICD-10-CM | POA: Diagnosis present

## 2023-11-12 DIAGNOSIS — R262 Difficulty in walking, not elsewhere classified: Secondary | ICD-10-CM | POA: Diagnosis not present

## 2023-11-12 DIAGNOSIS — R918 Other nonspecific abnormal finding of lung field: Secondary | ICD-10-CM | POA: Diagnosis not present

## 2023-11-12 DIAGNOSIS — R079 Chest pain, unspecified: Secondary | ICD-10-CM | POA: Diagnosis not present

## 2023-11-12 DIAGNOSIS — Z9861 Coronary angioplasty status: Secondary | ICD-10-CM | POA: Diagnosis not present

## 2023-11-12 DIAGNOSIS — Z923 Personal history of irradiation: Secondary | ICD-10-CM | POA: Diagnosis not present

## 2023-11-12 DIAGNOSIS — Z87891 Personal history of nicotine dependence: Secondary | ICD-10-CM | POA: Diagnosis not present

## 2023-11-12 DIAGNOSIS — K760 Fatty (change of) liver, not elsewhere classified: Secondary | ICD-10-CM | POA: Diagnosis not present

## 2023-11-12 DIAGNOSIS — I959 Hypotension, unspecified: Principal | ICD-10-CM | POA: Diagnosis present

## 2023-11-12 DIAGNOSIS — R5383 Other fatigue: Principal | ICD-10-CM

## 2023-11-12 DIAGNOSIS — R339 Retention of urine, unspecified: Secondary | ICD-10-CM | POA: Diagnosis present

## 2023-11-12 LAB — URINALYSIS, W/ REFLEX TO CULTURE (INFECTION SUSPECTED)
Bilirubin Urine: NEGATIVE
Glucose, UA: NEGATIVE mg/dL
Hgb urine dipstick: NEGATIVE
Ketones, ur: NEGATIVE mg/dL
Leukocytes,Ua: NEGATIVE
Nitrite: NEGATIVE
Protein, ur: NEGATIVE mg/dL
Specific Gravity, Urine: 1.018 (ref 1.005–1.030)
pH: 6 (ref 5.0–8.0)

## 2023-11-12 LAB — COMPREHENSIVE METABOLIC PANEL WITH GFR
ALT: 21 U/L (ref 0–44)
AST: 22 U/L (ref 15–41)
Albumin: 3.2 g/dL — ABNORMAL LOW (ref 3.5–5.0)
Alkaline Phosphatase: 52 U/L (ref 38–126)
Anion gap: 9 (ref 5–15)
BUN: 13 mg/dL (ref 8–23)
CO2: 23 mmol/L (ref 22–32)
Calcium: 9.3 mg/dL (ref 8.9–10.3)
Chloride: 107 mmol/L (ref 98–111)
Creatinine, Ser: 1.11 mg/dL (ref 0.61–1.24)
GFR, Estimated: >60 mL/min (ref >60)
Glucose, Bld: 117 mg/dL — ABNORMAL HIGH (ref 70–99)
Potassium: 3.9 mmol/L (ref 3.5–5.1)
Sodium: 139 mmol/L (ref 135–145)
Total Bilirubin: 1.3 mg/dL — ABNORMAL HIGH (ref 0.0–1.2)
Total Protein: 6.8 g/dL (ref 6.5–8.1)

## 2023-11-12 LAB — CBC WITH DIFFERENTIAL/PLATELET
Abs Immature Granulocytes: 0.07 K/uL (ref 0.00–0.07)
Basophils Absolute: 0.1 K/uL (ref 0.0–0.1)
Basophils Relative: 1 %
Eosinophils Absolute: 0 K/uL (ref 0.0–0.5)
Eosinophils Relative: 0 %
HCT: 39.7 % (ref 39.0–52.0)
Hemoglobin: 14 g/dL (ref 13.0–17.0)
Immature Granulocytes: 1 %
Lymphocytes Relative: 13 %
Lymphs Abs: 1.7 K/uL (ref 0.7–4.0)
MCH: 35.5 pg — ABNORMAL HIGH (ref 26.0–34.0)
MCHC: 35.3 g/dL (ref 30.0–36.0)
MCV: 100.8 fL — ABNORMAL HIGH (ref 80.0–100.0)
Monocytes Absolute: 1.7 K/uL — ABNORMAL HIGH (ref 0.1–1.0)
Monocytes Relative: 13 %
Neutro Abs: 9.2 K/uL — ABNORMAL HIGH (ref 1.7–7.7)
Neutrophils Relative %: 72 %
Platelets: 236 K/uL (ref 150–400)
RBC: 3.94 MIL/uL — ABNORMAL LOW (ref 4.22–5.81)
RDW: 12.4 % (ref 11.5–15.5)
WBC: 12.8 K/uL — ABNORMAL HIGH (ref 4.0–10.5)
nRBC: 0 % (ref 0.0–0.2)

## 2023-11-12 LAB — TROPONIN I (HIGH SENSITIVITY)
Troponin I (High Sensitivity): 7 ng/L (ref <18)
Troponin I (High Sensitivity): 7 ng/L (ref <18)

## 2023-11-12 LAB — I-STAT CG4 LACTIC ACID, ED
Lactic Acid, Venous: 2.1 mmol/L (ref 0.5–1.9)
Lactic Acid, Venous: 1.9 mmol/L (ref 0.5–1.9)

## 2023-11-12 LAB — PROTIME-INR
INR: 1.2 (ref 0.8–1.2)
Prothrombin Time: 15.7 s — ABNORMAL HIGH (ref 11.4–15.2)

## 2023-11-12 MED ORDER — SODIUM CHLORIDE 0.9 % IV BOLUS
1000.0000 mL | Freq: Once | INTRAVENOUS | Status: AC
  Administered 2023-11-12: 1000 mL via INTRAVENOUS

## 2023-11-12 MED ORDER — SODIUM CHLORIDE 0.9 % IV SOLN
2.0000 g | Freq: Once | INTRAVENOUS | Status: AC
  Administered 2023-11-12: 2 g via INTRAVENOUS
  Filled 2023-11-12: qty 20

## 2023-11-12 MED ORDER — IOHEXOL 350 MG/ML SOLN
75.0000 mL | Freq: Once | INTRAVENOUS | Status: AC | PRN
  Administered 2023-11-12: 75 mL via INTRAVENOUS

## 2023-11-12 NOTE — ED Triage Notes (Signed)
 Patient BIB EMS from home after daughter called stating that patient has been weak and confused for about 24 hours. C/O abdominal pain this AM that went away after taking antiacids. Urinary retention, patient's daughter states he has not urinated all day today. Patient feels hot to touch. 98/64 RA 93% 26-28 RR cbg 169

## 2023-11-12 NOTE — ED Notes (Signed)
 Assuming pt care, pt bib ems coming from home for confusion and unsteady gait x 1 day per daughter, pt med hx. Cardiac hx of stents, CHL. Pt aaox4, mae, skin warm/dry. Pt denies pain/discomfort, daughter at bedside.

## 2023-11-12 NOTE — ED Provider Notes (Signed)
  EMERGENCY DEPARTMENT AT [facility] Provider Note   CSN: [id number] Arrival date & time: 11/12/23  [id number]     Patient presents with: Altered Mental Status and Weakness   Ruben Conrad is a 83 y.o. male.    Altered Mental Status Associated symptoms: weakness   Weakness   Patient presents because of weakness as well as some slight altered mental status some lethargy per family.  Patient has had some slight epigastric abdominal pain.  No nausea or vomiting.  1-2 episodes of diarrhea.  No obvious fever but does endorse chills.  No pleuritic chest pain or hemoptysis.  No recent falls.  Does endorse some polyuria.  Maybe some slight dysuria as well.  Previous medical history reviewed : Patient was last seen in the ED in May 2024.  Was seen because of fall on Plavix .  Negative workup.     Prior to Admission medications   Medication Sig Start Date End Date Taking? Authorizing Provider  aspirin 325 MG tablet Take 325 mg by mouth daily.    [provider]  atenolol  (TENORMIN ) 50 MG tablet Take 50 mg by mouth daily. 09/25/17   [provider]  atorvastatin  (LIPITOR) 20 MG tablet Take 20 mg by mouth daily.  09/25/17   [provider]  Ca Phosphate-Cholecalciferol (CALTRATE GUMMY BITES) 250-10 MG-MCG CHEW Chew 2 each by mouth daily. 02/07/18   [provider]  Calcium Carbonate-Vit D-Min (CALTRATE 600+D PLUS MINERALS) 600-800 MG-UNIT CHEW Chew 1 each by mouth daily.     [provider]  cetirizine (ALL DAY ALLERGY) 10 MG tablet Take 10 mg by mouth daily.    [provider]  clopidogrel  (PLAVIX ) 75 MG tablet Take 75 mg by mouth daily.  11/22/17   [provider]  docusate sodium (COLACE) 100 MG capsule Take 200 mg by mouth daily.     [provider]  famotidine (PEPCID) 20 MG tablet Take 20 mg by mouth daily.    [provider]    Allergies: Primidone  and Propranolol    Review of Systems   Neurological:  Positive for weakness.    Updated Vital Signs BP (!) 96/52   Pulse (!) 57   Temp 98.6 F (37 C)   Resp 16   Ht 5' 11 (1.803 m)   Wt 76.7 kg   SpO2 96%   BMI 23.57 kg/m   Physical Exam Vitals and nursing note reviewed.  Constitutional:      General: He is not in acute distress.    Appearance: He is well-developed.  HENT:     Head: Normocephalic and atraumatic.  Eyes:     Conjunctiva/sclera: Conjunctivae normal.  Cardiovascular:     Rate and Rhythm: Normal rate and regular rhythm.     Heart sounds: No murmur heard. Pulmonary:     Effort: Pulmonary effort is normal. No respiratory distress.     Breath sounds: Normal breath sounds.  Abdominal:     Palpations: Abdomen is soft.     Tenderness: There is no abdominal tenderness.  Musculoskeletal:        General: No swelling.     Cervical back: Neck supple.  Skin:    General: Skin is warm and dry.     Capillary Refill: Capillary refill takes less than 2 seconds.  Neurological:     Mental Status: He is alert.  Psychiatric:        Mood and Affect: Mood normal.     (all labs  ordered are listed, but only abnormal results are displayed) Labs Reviewed  COMPREHENSIVE METABOLIC PANEL WITH GFR - Abnormal; Notable for the following components:      Result Value   Glucose, Bld 117 (*)    Albumin 3.2 (*)    Total Bilirubin 1.3 (*)    All other components within normal limits  CBC WITH DIFFERENTIAL/PLATELET - Abnormal; Notable for the following components:   WBC 12.8 (*)    RBC 3.94 (*)    MCV 100.8 (*)    MCH 35.5 (*)    Neutro Abs 9.2 (*)    Monocytes Absolute 1.7 (*)    All other components within normal limits  PROTIME-INR - Abnormal; Notable for the following components:   Prothrombin Time 15.7 (*)    All other components within normal limits  URINALYSIS, W/ REFLEX TO CULTURE (INFECTION SUSPECTED) - Abnormal; Notable for the following components:   Bacteria, UA RARE (*)    All other components  within normal limits  I-STAT CG4 LACTIC ACID, ED - Abnormal; Notable for the following components:   Lactic Acid, Venous 2.1 (*)    All other components within normal limits  CULTURE, BLOOD (ROUTINE X 2)  CULTURE, BLOOD (ROUTINE X 2)  RESP PANEL BY RT-PCR (RSV, FLU A&B, COVID)  [id number]  I-STAT CG4 LACTIC ACID, ED  TROPONIN I (HIGH SENSITIVITY)  TROPONIN I (HIGH SENSITIVITY)    EKG: EKG Interpretation Date/Time:  Monday November 12 2023 17:52:24 EDT Ventricular Rate:  75 PR Interval:  223 QRS Duration:  83 QT Interval:  361 QTC Calculation: 404 R Axis:   27  Text Interpretation: Sinus rhythm Prolonged PR interval Low voltage, precordial leads Borderline ST elevation, lateral leads Confirmed by Simon Rea [phone]) on 11/12/2023 8:40:17 PM  Radiology: CT ABDOMEN PELVIS W CONTRAST Result Date: 11/12/2023 EXAM: CT ABDOMEN AND PELVIS WITH CONTRAST 11/12/2023 09:42:31 PM TECHNIQUE: CT of the abdomen and pelvis was performed with the administration of 75 mL iohexol  (OMNIPAQUE ) 350 MG/ML injection. Multiplanar reformatted images are provided for review. Automated exposure control, iterative reconstruction, and/or weight-based adjustment of the mA/kV was utilized to reduce the radiation dose to as low as reasonably achievable. COMPARISON: None available. CLINICAL HISTORY: Epigastric abdominal pain with nausea. Sepsis workup, weakness, AMS. FINDINGS: LOWER CHEST: 4 x 5 mm subpleural right lower lobe pulmonary nodule. Bilateral lower lobe atelectasis. Coronary artery calcifications. LIVER: The liver is unremarkable. GALLBLADDER AND BILE DUCTS: Cholelithiasis. No gallbladder wall thickening or pericholecystic fluid. No biliary ductal dilatation. SPLEEN: No acute abnormality. PANCREAS: No acute abnormality. ADRENAL GLANDS: No acute abnormality. KIDNEYS, URETERS AND BLADDER: Fluid density lesion of the left kidney likely represents simple renal cysts. Per consensus, no follow-up is needed for simple  Bosniak type 1 and 2 renal cysts, unless the patient has a malignancy history or risk factors. No filling defects of the partially visualized collecting systems on delayed imaging. No stones in the kidneys or ureters. No hydronephrosis. No perinephric or periureteral stranding. Urinary bladder is unremarkable. GI AND BOWEL: No small or large bowel wall thickening or dilatation. Stomach demonstrates no acute abnormality. The appendix is not definitely identified with no inflammatory changes in the right lower quadrant to suggest acute appendicitis. PERITONEUM AND RETROPERITONEUM: No ascites. No free air. VASCULATURE: Severe atherosclerotic plaque. Infrarenal abdominal aorta aneurysm measuring up to 3.1 cm. LYMPH NODES: No lymphadenopathy. REPRODUCTIVE ORGANS: No acute abnormality. BONES AND SOFT TISSUES: Partially visualized intramedullary nail fixation of the left femur. No acute osseous abnormality. No focal soft  tissue abnormality. IMPRESSION: 1. No acute findings in the abdomen or pelvis. 2. Cholelithiasis with no CT evidence of acute cholecystitis. 3. Infrarenal abdominal aortic aneurysm measuring up to 3.1 cm.  Recommend follow-up ultrasound every 3 years. (Ref.: J Vasc Surg. 2018; 67:2-77 and J Am Coll Radiol 2013;10(10):789-794.)  4. Severe atherosclerotic plaque. 5. Right lower lobe pulmonary micronodule. No further follow-up indicated unless the patient high risk - an optional CT can be obtained at 12 months. Electronically signed by: Morgane Naveau MD 11/12/2023 10:04 PM EDT RP Workstation: [id number]   DG Chest Port 1 View Result Date: 11/12/2023 CLINICAL DATA:  Questionable sepsis. EXAM: PORTABLE CHEST 1 VIEW COMPARISON:  Chest CT from 2020 FINDINGS: The cardiac silhouette, mediastinal and hilar contours are within normal limits. Stable mild tortuosity and calcification of the thoracic aorta. The lungs are clear of an acute process. Streaky left basilar scarring changes again noted. No pleural  effusions or pulmonary lesions. IMPRESSION: No acute cardiopulmonary findings. Electronically Signed   By: MYRTIS Stammer M.D.   On: 11/12/2023 18:39     Procedures   Medications Ordered in the ED  cefTRIAXone (ROCEPHIN) 2 g in sodium chloride  0.9 % 100 mL IVPB (2 g Intravenous New Bag/Given 11/12/23 1858)  sodium chloride  0.9 % bolus 1,000 mL (1,000 mLs Intravenous New Bag/Given 11/12/23 1857)  iohexol  (OMNIPAQUE ) 350 MG/ML injection 75 mL (75 mLs Intravenous Contrast Given 11/12/23 2142)  sodium chloride  0.9 % bolus 1,000 mL (1,000 mLs Intravenous New Bag/Given 11/12/23 2242)                                    Medical Decision Making Amount and/or Complexity of Data Reviewed Labs: ordered. Radiology: ordered.  Risk Prescription drug management.     HPI:    Patient presents because of weakness as well as some slight altered mental status some lethargy per family.  Patient has had some slight epigastric abdominal pain.  No nausea or vomiting.  1-2 episodes of diarrhea.  No obvious fever but does endorse chills.  No pleuritic chest pain or hemoptysis.  No recent falls.  Does endorse some polyuria.  Maybe some slight dysuria as well.  Previous medical history reviewed : Patient was last seen in the ED in May 2024.  Was seen because of fall on Plavix .  Negative workup.   MDM:    Upon exam, patient ANO x 3 GCS 15.  No focal deficits.  Cranial nerves II through XII intact.  NIH is 0.  No concern CVA.  Negative Kernig's and Brudzinski's.  No pain with neck flexion.  No concerns for meningitis or other intracranial infection causing patient's maybe slightly altered mental status.   Did attain laboratory workup.  On assessment, of acute infectious etiology that could be causing patient's lethargy.  Obtain lactic acid as well as CBC.  Will obtain blood cultures if either 1 of these are positive or concerning.  Obtaining UA.  Plan obtain a CT scan of the abdomen as well to rule out any  kind of acute pathology given the diarrhea and the lethargy.  Reevaluation:   Patient remains hemodynamically stable.  Maps have dropped a few times but otherwise responds well to fluid.  Asymptomatic from the episodic drop in blood pressure.  Patient has had 2 L of fluid.  Labs did show some slight leukocytosis and left shift.  Slight elevation of lactic acid to 2.1.  Responded well to fluid.  Otherwise, laboratory workup largely unremarkable.  Patient was started on ceftriaxone initially because there is concerned about possible UTI driving patient's presentation given the polyuria.  UA ended up being clear.  Rare bacteria but no nitrites or leuk esterase  On the CT scan of the abdomen pelvis.  CT scan showed evidence of cholelithiasis but no CT evidence of acute cholecystitis.  Given that he had some pain in epigastric region, I think it is reasonable to obtain right upper quadrant ultrasound to rule out acute cholecystitis based off ultrasound.  He has a infrarenal abdominal aortic aneurysm that can be followed up outpatient  Also did obtain troponin given the epigastric pain.  Will do to rule out any kind of ACS process.  EKG sinus rhythm.  No STEMI arrhythmia.  Troponin x 2 unremarkable.  No concerns for ACS pathology driving patient's presentation today  Spoke to hospitalist.  Agreed to admission.  Pending admission orders per   Interventions: 2 L NS, ceftriaxone   EKG Interpreted by Me: NSR   Cardiac Tele Interpreted by Me: NSR   I have independently interpreted the CXR  and CT  images and agree with the radiologist finding   Social Determinant of Health: None    Disposition and Follow Up: Admit       Final diagnoses:  Lethargy  Leukocytosis, unspecified type  Lactic acidosis  Calculus of gallbladder without cholecystitis without obstruction    ED Discharge Orders     None          Simon Lavonia SAILOR, MD 11/12/23 2350

## 2023-11-12 NOTE — ED Notes (Signed)
 Ruben Conrad [phone] daughter

## 2023-11-13 ENCOUNTER — Observation Stay (HOSPITAL_COMMUNITY)

## 2023-11-13 ENCOUNTER — Encounter (HOSPITAL_COMMUNITY): Payer: Self-pay | Admitting: Internal Medicine

## 2023-11-13 DIAGNOSIS — Z888 Allergy status to other drugs, medicaments and biological substances status: Secondary | ICD-10-CM | POA: Diagnosis not present

## 2023-11-13 DIAGNOSIS — Z9861 Coronary angioplasty status: Secondary | ICD-10-CM

## 2023-11-13 DIAGNOSIS — E8809 Other disorders of plasma-protein metabolism, not elsewhere classified: Secondary | ICD-10-CM | POA: Diagnosis present

## 2023-11-13 DIAGNOSIS — I672 Cerebral atherosclerosis: Secondary | ICD-10-CM | POA: Diagnosis not present

## 2023-11-13 DIAGNOSIS — R079 Chest pain, unspecified: Secondary | ICD-10-CM | POA: Diagnosis not present

## 2023-11-13 DIAGNOSIS — I959 Hypotension, unspecified: Secondary | ICD-10-CM | POA: Diagnosis present

## 2023-11-13 DIAGNOSIS — J439 Emphysema, unspecified: Secondary | ICD-10-CM | POA: Diagnosis not present

## 2023-11-13 DIAGNOSIS — E7800 Pure hypercholesterolemia, unspecified: Secondary | ICD-10-CM | POA: Diagnosis present

## 2023-11-13 DIAGNOSIS — I700 Atherosclerosis of aorta: Secondary | ICD-10-CM | POA: Diagnosis present

## 2023-11-13 DIAGNOSIS — R1013 Epigastric pain: Secondary | ICD-10-CM | POA: Diagnosis not present

## 2023-11-13 DIAGNOSIS — I252 Old myocardial infarction: Secondary | ICD-10-CM | POA: Diagnosis not present

## 2023-11-13 DIAGNOSIS — Z7902 Long term (current) use of antithrombotics/antiplatelets: Secondary | ICD-10-CM | POA: Diagnosis not present

## 2023-11-13 DIAGNOSIS — Z955 Presence of coronary angioplasty implant and graft: Secondary | ICD-10-CM | POA: Diagnosis not present

## 2023-11-13 DIAGNOSIS — Z8521 Personal history of malignant neoplasm of larynx: Secondary | ICD-10-CM | POA: Diagnosis not present

## 2023-11-13 DIAGNOSIS — E876 Hypokalemia: Secondary | ICD-10-CM | POA: Diagnosis not present

## 2023-11-13 DIAGNOSIS — I2510 Atherosclerotic heart disease of native coronary artery without angina pectoris: Secondary | ICD-10-CM | POA: Diagnosis present

## 2023-11-13 DIAGNOSIS — Z7982 Long term (current) use of aspirin: Secondary | ICD-10-CM | POA: Diagnosis not present

## 2023-11-13 DIAGNOSIS — I10 Essential (primary) hypertension: Secondary | ICD-10-CM | POA: Diagnosis present

## 2023-11-13 DIAGNOSIS — R918 Other nonspecific abnormal finding of lung field: Secondary | ICD-10-CM | POA: Diagnosis not present

## 2023-11-13 DIAGNOSIS — D7589 Other specified diseases of blood and blood-forming organs: Secondary | ICD-10-CM | POA: Diagnosis present

## 2023-11-13 DIAGNOSIS — E872 Acidosis, unspecified: Secondary | ICD-10-CM | POA: Diagnosis present

## 2023-11-13 DIAGNOSIS — Z87891 Personal history of nicotine dependence: Secondary | ICD-10-CM | POA: Diagnosis not present

## 2023-11-13 DIAGNOSIS — R339 Retention of urine, unspecified: Secondary | ICD-10-CM | POA: Diagnosis present

## 2023-11-13 DIAGNOSIS — R4182 Altered mental status, unspecified: Secondary | ICD-10-CM | POA: Diagnosis not present

## 2023-11-13 DIAGNOSIS — D638 Anemia in other chronic diseases classified elsewhere: Secondary | ICD-10-CM | POA: Diagnosis present

## 2023-11-13 DIAGNOSIS — R531 Weakness: Secondary | ICD-10-CM | POA: Diagnosis not present

## 2023-11-13 DIAGNOSIS — Z79899 Other long term (current) drug therapy: Secondary | ICD-10-CM | POA: Diagnosis not present

## 2023-11-13 DIAGNOSIS — Z923 Personal history of irradiation: Secondary | ICD-10-CM | POA: Diagnosis not present

## 2023-11-13 DIAGNOSIS — K8020 Calculus of gallbladder without cholecystitis without obstruction: Secondary | ICD-10-CM | POA: Insufficient documentation

## 2023-11-13 LAB — ECHOCARDIOGRAM COMPLETE
Calc EF: 58.9 %
Height: 71 in
S' Lateral: 3.6 cm
Single Plane A2C EF: 58.1 %
Single Plane A4C EF: 59.5 %
Weight: 2704 [oz_av]

## 2023-11-13 LAB — CBC WITH DIFFERENTIAL/PLATELET
Abs Immature Granulocytes: 0.06 K/uL (ref 0.00–0.07)
Basophils Absolute: 0.1 K/uL (ref 0.0–0.1)
Basophils Relative: 1 %
Eosinophils Absolute: 0.1 K/uL (ref 0.0–0.5)
Eosinophils Relative: 1 %
HCT: 36.6 % — ABNORMAL LOW (ref 39.0–52.0)
Hemoglobin: 12.8 g/dL — ABNORMAL LOW (ref 13.0–17.0)
Immature Granulocytes: 1 %
Lymphocytes Relative: 22 %
Lymphs Abs: 2.5 K/uL (ref 0.7–4.0)
MCH: 35.8 pg — ABNORMAL HIGH (ref 26.0–34.0)
MCHC: 35 g/dL (ref 30.0–36.0)
MCV: 102.2 fL — ABNORMAL HIGH (ref 80.0–100.0)
Monocytes Absolute: 1.7 K/uL — ABNORMAL HIGH (ref 0.1–1.0)
Monocytes Relative: 16 %
Neutro Abs: 6.7 K/uL (ref 1.7–7.7)
Neutrophils Relative %: 59 %
Platelets: 208 K/uL (ref 150–400)
RBC: 3.58 MIL/uL — ABNORMAL LOW (ref 4.22–5.81)
RDW: 12.5 % (ref 11.5–15.5)
WBC: 11.2 K/uL — ABNORMAL HIGH (ref 4.0–10.5)
nRBC: 0 % (ref 0.0–0.2)

## 2023-11-13 LAB — BASIC METABOLIC PANEL WITH GFR
Anion gap: 7 (ref 5–15)
BUN: 12 mg/dL (ref 8–23)
CO2: 22 mmol/L (ref 22–32)
Calcium: 8.1 mg/dL — ABNORMAL LOW (ref 8.9–10.3)
Chloride: 108 mmol/L (ref 98–111)
Creatinine, Ser: 0.97 mg/dL (ref 0.61–1.24)
GFR, Estimated: >60 mL/min (ref >60)
Glucose, Bld: 96 mg/dL (ref 70–99)
Potassium: 3.8 mmol/L (ref 3.5–5.1)
Sodium: 137 mmol/L (ref 135–145)

## 2023-11-13 LAB — HEPATIC FUNCTION PANEL
ALT: 15 U/L (ref 0–44)
AST: 17 U/L (ref 15–41)
Albumin: 2.9 g/dL — ABNORMAL LOW (ref 3.5–5.0)
Alkaline Phosphatase: 45 U/L (ref 38–126)
Bilirubin, Direct: 0.2 mg/dL (ref 0.0–0.2)
Indirect Bilirubin: 1.2 mg/dL — ABNORMAL HIGH (ref 0.3–0.9)
Total Bilirubin: 1.4 mg/dL — ABNORMAL HIGH (ref 0.0–1.2)
Total Protein: 5.8 g/dL — ABNORMAL LOW (ref 6.5–8.1)

## 2023-11-13 LAB — RESP PANEL BY RT-PCR (RSV, FLU A&B, COVID)  RVPGX2
Influenza A by PCR: NEGATIVE
Influenza B by PCR: NEGATIVE
Resp Syncytial Virus by PCR: NEGATIVE
SARS Coronavirus 2 by RT PCR: NEGATIVE

## 2023-11-13 LAB — CBG MONITORING, ED: Glucose-Capillary: 95 mg/dL (ref 70–99)

## 2023-11-13 LAB — TSH: TSH: 0.744 u[IU]/mL (ref 0.350–4.500)

## 2023-11-13 LAB — MAGNESIUM: Magnesium: 1.6 mg/dL — ABNORMAL LOW (ref 1.7–2.4)

## 2023-11-13 LAB — CORTISOL: Cortisol, Plasma: 15.6 ug/dL

## 2023-11-13 MED ORDER — VANCOMYCIN HCL IN DEXTROSE 1-5 GM/200ML-% IV SOLN: 1000.0000 mg | INTRAVENOUS | Status: DC

## 2023-11-13 MED ORDER — LACTATED RINGERS IV SOLN: INTRAVENOUS | Status: AC

## 2023-11-13 MED ORDER — ASPIRIN 325 MG PO TABS
325.0000 mg | ORAL_TABLET | Freq: Every day | ORAL | Status: DC
  Administered 2023-11-13 – 2023-11-15 (×3): 325 mg via ORAL
  Filled 2023-11-13 (×3): qty 1

## 2023-11-13 MED ORDER — VANCOMYCIN HCL 1500 MG/300ML IV SOLN
1500.0000 mg | Freq: Once | INTRAVENOUS | Status: AC
  Administered 2023-11-13: 1500 mg via INTRAVENOUS
  Filled 2023-11-13 (×2): qty 300

## 2023-11-13 MED ORDER — SODIUM CHLORIDE 0.9 % IV SOLN
2.0000 g | Freq: Two times a day (BID) | INTRAVENOUS | Status: DC
  Administered 2023-11-13 – 2023-11-14 (×4): 2 g via INTRAVENOUS
  Filled 2023-11-13 (×5): qty 12.5

## 2023-11-13 MED ORDER — CLOPIDOGREL BISULFATE 75 MG PO TABS
75.0000 mg | ORAL_TABLET | Freq: Every day | ORAL | Status: DC
  Administered 2023-11-13 – 2023-11-15 (×3): 75 mg via ORAL
  Filled 2023-11-13 (×3): qty 1

## 2023-11-13 MED ORDER — ATORVASTATIN CALCIUM 10 MG PO TABS
20.0000 mg | ORAL_TABLET | Freq: Every day | ORAL | Status: DC
  Administered 2023-11-13 – 2023-11-15 (×3): 20 mg via ORAL
  Filled 2023-11-13 (×3): qty 2

## 2023-11-13 MED ORDER — MAGNESIUM SULFATE 2 GM/50ML IV SOLN
2.0000 g | Freq: Once | INTRAVENOUS | Status: AC
  Administered 2023-11-13: 2 g via INTRAVENOUS
  Filled 2023-11-13: qty 50

## 2023-11-13 MED ORDER — IOHEXOL 350 MG/ML SOLN
75.0000 mL | Freq: Once | INTRAVENOUS | Status: AC | PRN
Start: 2023-11-13 — End: 2023-11-13
  Administered 2023-11-13: 75 mL via INTRAVENOUS

## 2023-11-13 MED ORDER — VANCOMYCIN HCL 1250 MG/250ML IV SOLN
1250.0000 mg | INTRAVENOUS | Status: DC
  Administered 2023-11-14: 1250 mg via INTRAVENOUS
  Filled 2023-11-13 (×2): qty 250

## 2023-11-13 MED ORDER — FAMOTIDINE 20 MG PO TABS
20.0000 mg | ORAL_TABLET | Freq: Every day | ORAL | Status: DC
  Administered 2023-11-13 – 2023-11-15 (×3): 20 mg via ORAL
  Filled 2023-11-13 (×3): qty 1

## 2023-11-13 MED ORDER — ENOXAPARIN SODIUM 40 MG/0.4ML IJ SOSY
40.0000 mg | PREFILLED_SYRINGE | INTRAMUSCULAR | Status: DC
  Administered 2023-11-13: 40 mg via SUBCUTANEOUS
  Filled 2023-11-13: qty 0.4

## 2023-11-13 MED ORDER — LACTATED RINGERS IV BOLUS
500.0000 mL | Freq: Once | INTRAVENOUS | Status: AC
Start: 2023-11-13 — End: 2023-11-13
  Administered 2023-11-13: 500 mL via INTRAVENOUS

## 2023-11-13 NOTE — Progress Notes (Signed)
 PHARMACY NOTE:  ANTIMICROBIAL RENAL DOSAGE ADJUSTMENT  Current antimicrobial regimen includes a mismatch between antimicrobial dosage and estimated renal function.  As per policy approved by the Pharmacy & Therapeutics and Medical Executive Committees, the antimicrobial dosage will be adjusted accordingly.  Current antimicrobial dosage:  vancomycin 1000mg  q24h   Indication: sepsis   Renal Function:  Estimated Creatinine Clearance: 60.4 mL/min (by C-G formula based on SCr of 0.97 mg/dL). []      On intermittent HD, scheduled: []      On CRRT    Antimicrobial dosage has been changed to:  vancomycin 1250mg  q24h for [facility] 420   Additional comments:   Thank you for allowing pharmacy to be a part of this patient's care.  Powell Blush, PharmD, BCCCP  11/13/2023 7:54 AM

## 2023-11-13 NOTE — H&P (Addendum)
 History and Physical    Ruben Conrad FMW:[id number] DOB: February 01, 1940 DOA: 11/12/2023  Patient coming from: Home.  Chief Complaint: Weakness.  HPI: Ruben Conrad is a 83 y.o. male with history of CAD status post PCI, laryngeal carcinoma s/p radiation being followed by ENT surgeon, hypertension was brought to the ER after patient's daughter found that patient was complaining of epigastric and lower sternal chest pain since this morning.  Patient also was weak and almost had a syncopal episode.  For brief episode patient also looks confused as per the patient's daughter.  Has not had much food this morning.  Denies any nausea vomiting diarrhea productive cough fever or chills.  Due to these above symptoms patient was brought to the ER.  ED Course: In the ER patient's blood pressure was in the low normal with elevated lactic acid and leukocytosis concerning for sepsis.  Patient also had urinary retention and had to have In-N-Out cath.  UA is unremarkable.  Chest x-ray did not show anything acute.  Patient was empirically started on antibiotics given the hypotension leukocytosis elevated lactic acid concerning for developing sepsis.  Troponins were negative EKG is pending.  Review of Systems: As per HPI, rest all negative.   Past Medical History:  Diagnosis Date   Cancer [facility])    head/neck   Coronary artery disease    with stents   History of radiation therapy 04/15/18- 05/22/18   Head and neck/ Larynx 28 fractions. 2.25 Gy each for total of 63 Gy.    Hypercholesteremia    Hypertension    Myocardial infarction (HCC)    Tremors of nervous system     Past Surgical History:  Procedure Laterality Date   APPENDECTOMY     CORONARY ANGIOPLASTY WITH STENT PLACEMENT     DIRECT LARYNGOSCOPY N/A 02/25/2018   Procedure: SUSPENDED DIRECT MICROLARYNGOSCOPY WITH CO2 LASER VOCAL CORD BIOPSY;  Surgeon: Carlie Clark, MD;  Location: [facility] OR;  Service: ENT;  Laterality: N/A;   HIP FRACTURE SURGERY Left  2018   SKIN SURGERY  10/2021     reports that he quit smoking about 11 years ago. His smoking use included cigarettes. He started smoking about 61 years ago. He has never used smokeless tobacco. He reports that he does not drink alcohol and does not use drugs.  Allergies  Allergen Reactions   Primidone  Hives   Propranolol Hives    Family History  Problem Relation Age of Onset   Cancer Father        prostate    Prior to Admission medications   Medication Sig Start Date End Date Taking? Authorizing Provider  aspirin 325 MG tablet Take 325 mg by mouth daily.    [provider]  atenolol  (TENORMIN ) 50 MG tablet Take 50 mg by mouth daily. 09/25/17   [provider]  atorvastatin  (LIPITOR) 20 MG tablet Take 20 mg by mouth daily.  09/25/17   [provider]  Ca Phosphate-Cholecalciferol (CALTRATE GUMMY BITES) 250-10 MG-MCG CHEW Chew 2 each by mouth daily. 02/07/18   [provider]  Calcium Carbonate-Vit D-Min (CALTRATE 600+D PLUS MINERALS) 600-800 MG-UNIT CHEW Chew 1 each by mouth daily.     [provider]  cetirizine (ALL DAY ALLERGY) 10 MG tablet Take 10 mg by mouth daily.    [provider]  clopidogrel  (PLAVIX ) 75 MG tablet Take 75 mg by mouth daily.  11/22/17   [provider]  docusate sodium (COLACE) 100 MG capsule Take 200 mg by mouth daily.  [provider]  famotidine (PEPCID) 20 MG tablet Take 20 mg by mouth daily.    [provider]    Physical Exam: Constitutional: Moderately built and nourished. Vitals:   11/12/23 2115 11/12/23 2130 11/12/23 2246 11/13/23 0021  BP:   (!) 96/52 (!) 103/54  Pulse: (!) 59 63 (!) 57 (!) 56  Resp: 15  16 19   Temp:   98.6 F (37 C) 98.7 F (37.1 C)  TempSrc:    Oral  SpO2: 97% 98% 96% 96%  Weight:      Height:       Eyes: Anicteric no pallor. ENMT: No discharge from the ears eyes nose or mouth. Neck: No mass felt.  No neck rigidity. Respiratory: No  rhonchi or crepitations. Cardiovascular: S1-S2 heard. Abdomen: Soft nontender bowel sound present. Musculoskeletal: No edema. Skin: No rash. Neurologic: Alert awake oriented to time place and person.  Moves all extremities. Psychiatric: Appears normal.  Normal affect.   Labs on Admission: I have personally reviewed following labs and imaging studies  CBC: Recent Labs  Lab 11/12/23 1830  WBC 12.8*  NEUTROABS 9.2*  HGB 14.0  HCT 39.7  MCV 100.8*  PLT 236   Basic Metabolic Panel: Recent Labs  Lab 11/12/23 1830  NA 139  K 3.9  CL 107  CO2 23  GLUCOSE 117*  BUN 13  CREATININE 1.11  CALCIUM 9.3   GFR: Estimated Creatinine Clearance: 52.8 mL/min (by C-G formula based on SCr of 1.11 mg/dL). Liver Function Tests: Recent Labs  Lab 11/12/23 1830  AST 22  ALT 21  ALKPHOS 52  BILITOT 1.3*  PROT 6.8  ALBUMIN 3.2*   No results for input(s): LIPASE, AMYLASE in the last 168 hours. No results for input(s): AMMONIA in the last 168 hours. Coagulation Profile: Recent Labs  Lab 11/12/23 1830  INR 1.2   Cardiac Enzymes: No results for input(s): CKTOTAL, CKMB, CKMBINDEX, TROPONINI in the last 168 hours. BNP (last 3 results) No results for input(s): PROBNP in the last 8760 hours. HbA1C: No results for input(s): HGBA1C in the last 72 hours. CBG: No results for input(s): GLUCAP in the last 168 hours. Lipid Profile: No results for input(s): CHOL, HDL, LDLCALC, TRIG, CHOLHDL, LDLDIRECT in the last 72 hours. Thyroid  Function Tests: No results for input(s): TSH, T4TOTAL, FREET4, T3FREE, THYROIDAB in the last 72 hours. Anemia Panel: No results for input(s): VITAMINB12, FOLATE, FERRITIN, TIBC, IRON, RETICCTPCT in the last 72 hours. Urine analysis:    Component Value Date/Time   COLORURINE YELLOW 11/12/2023 2032   APPEARANCEUR CLEAR 11/12/2023 2032   LABSPEC 1.018 11/12/2023 2032   PHURINE 6.0 11/12/2023 2032   GLUCOSEU  NEGATIVE 11/12/2023 2032   HGBUR NEGATIVE 11/12/2023 2032   BILIRUBINUR NEGATIVE 11/12/2023 2032   KETONESUR NEGATIVE 11/12/2023 2032   PROTEINUR NEGATIVE 11/12/2023 2032   NITRITE NEGATIVE 11/12/2023 2032   LEUKOCYTESUR NEGATIVE 11/12/2023 2032   Sepsis Labs: @LABRCNTIP (procalcitonin:4,lacticidven:4) )No results found for this or any previous visit (from the past 240 hours).   Radiological Exams on Admission: US  Abdomen Limited RUQ (LIVER/GB) Result Date: 11/13/2023 EXAM: Right Upper Quadrant Abdominal Ultrasound 11/12/2023 11:55:13 PM TECHNIQUE: Real-time ultrasonography of the right upper quadrant of the abdomen was performed. COMPARISON: CT abdomen and pelvis 10:20:25 CLINICAL HISTORY: Abdominal pain. FINDINGS: LIVER: Increased hepatic parenchyma echogenicity. No intrahepatic biliary ductal dilatation. No evidence of mass. Portal vein is patent with normal directional flow. BILIARY SYSTEM: Calcified gallstones within the gallbladder lumen. No gallbladder wall thickening or pericholecystic fluid.  Negative sonographic Murphy's sign. Common bile duct is within normal limits measuring  3 mm . RIGHT KIDNEY: The right kidney is grossly unremarkable in appearances without evidence of hydronephrosis, echogenic calculi or worrisome mass lesions. PANCREAS: Visualized portions of the pancreas are unremarkable. OTHER: No right upper quadrant ascites. IMPRESSION: 1. Cholelithiasis without sonographic evidence of acute cholecystitis. 2. Hepatic steatosis. Electronically signed by: Morgane Naveau MD 11/13/2023 12:00 AM EDT RP Workstation: [id number]   CT ABDOMEN PELVIS W CONTRAST Result Date: 11/12/2023 EXAM: CT ABDOMEN AND PELVIS WITH CONTRAST 11/12/2023 09:42:31 PM TECHNIQUE: CT of the abdomen and pelvis was performed with the administration of 75 mL iohexol  (OMNIPAQUE ) 350 MG/ML injection. Multiplanar reformatted images are provided for review. Automated exposure control, iterative reconstruction, and/or  weight-based adjustment of the mA/kV was utilized to reduce the radiation dose to as low as reasonably achievable. COMPARISON: None available. CLINICAL HISTORY: Epigastric abdominal pain with nausea. Sepsis workup, weakness, AMS. FINDINGS: LOWER CHEST: 4 x 5 mm subpleural right lower lobe pulmonary nodule. Bilateral lower lobe atelectasis. Coronary artery calcifications. LIVER: The liver is unremarkable. GALLBLADDER AND BILE DUCTS: Cholelithiasis. No gallbladder wall thickening or pericholecystic fluid. No biliary ductal dilatation. SPLEEN: No acute abnormality. PANCREAS: No acute abnormality. ADRENAL GLANDS: No acute abnormality. KIDNEYS, URETERS AND BLADDER: Fluid density lesion of the left kidney likely represents simple renal cysts. Per consensus, no follow-up is needed for simple Bosniak type 1 and 2 renal cysts, unless the patient has a malignancy history or risk factors. No filling defects of the partially visualized collecting systems on delayed imaging. No stones in the kidneys or ureters. No hydronephrosis. No perinephric or periureteral stranding. Urinary bladder is unremarkable. GI AND BOWEL: No small or large bowel wall thickening or dilatation. Stomach demonstrates no acute abnormality. The appendix is not definitely identified with no inflammatory changes in the right lower quadrant to suggest acute appendicitis. PERITONEUM AND RETROPERITONEUM: No ascites. No free air. VASCULATURE: Severe atherosclerotic plaque. Infrarenal abdominal aorta aneurysm measuring up to 3.1 cm. LYMPH NODES: No lymphadenopathy. REPRODUCTIVE ORGANS: No acute abnormality. BONES AND SOFT TISSUES: Partially visualized intramedullary nail fixation of the left femur. No acute osseous abnormality. No focal soft tissue abnormality. IMPRESSION: 1. No acute findings in the abdomen or pelvis. 2. Cholelithiasis with no CT evidence of acute cholecystitis. 3. Infrarenal abdominal aortic aneurysm measuring up to 3.1 cm.  Recommend  follow-up ultrasound every 3 years. (Ref.: J Vasc Surg. 2018; 67:2-77 and J Am Coll Radiol 2013;10(10):789-794.)  4. Severe atherosclerotic plaque. 5. Right lower lobe pulmonary micronodule. No further follow-up indicated unless the patient high risk - an optional CT can be obtained at 12 months. Electronically signed by: Morgane Naveau MD 11/12/2023 10:04 PM EDT RP Workstation: [id number]   DG Chest Port 1 View Result Date: 11/12/2023 CLINICAL DATA:  Questionable sepsis. EXAM: PORTABLE CHEST 1 VIEW COMPARISON:  Chest CT from 2020 FINDINGS: The cardiac silhouette, mediastinal and hilar contours are within normal limits. Stable mild tortuosity and calcification of the thoracic aorta. The lungs are clear of an acute process. Streaky left basilar scarring changes again noted. No pleural effusions or pulmonary lesions. IMPRESSION: No acute cardiopulmonary findings. Electronically Signed   By: MYRTIS Stammer M.D.   On: 11/12/2023 18:39     Assessment/Plan Principal Problem:   Epigastric abdominal pain Active Problems:   Malignant neoplasm of glottis (HCC)   Epigastric pain   CAD S/P percutaneous coronary angioplasty   Gallstones    Hypotension and weakness - not sure of the reason  for the hypotension.  Patient had no change in medications only takes beta-blockers for the CAD.  Denies any nausea vomiting or diarrhea.  Given the hypotensive episode with leukocytosis and lactic acidosis was empirically started antibiotics for possible sepsis and cultures obtained.  For now we will continue antibiotics.  Patient is responding to fluids.  Check cortisol levels. Epigastric/lower sternal pain with history of PCI.  Patient states he had PCI done in [place] .  Cardiac markers were negative.  Check 2D echo.  EKG is pending.  Continue antiplatelet agents and statin.  Holding beta-blockers due to low normal blood pressure will consult cardiology.  CT angiogram of the chest ordered. Gallstone could be causing  patient's epigastric discomfort.  However will first rule out CAD as a cause.  Follow LFTs.  Ultrasound of the abdomen showed gallstones with no signs of cholecystitis. History of laryngeal carcinoma status post radiation therapy. Macrocytosis check B12 and folate levels.  Since patient has epigastric pain with history of CAD and hypotension will need close monitoring further workup and more than 2 midnight stay.   DVT prophylaxis: Lovenox. Code Status: Full code. Family Communication: Daughter. Disposition Plan: Monitored bed. Consults called: Cardiology. Admission status: Observation.

## 2023-11-13 NOTE — Progress Notes (Signed)
 Echocardiogram 2D Echocardiogram has been performed.  Ruben Conrad 11/13/2023, 10:52 AM

## 2023-11-13 NOTE — Progress Notes (Addendum)
 Brief rounding note, same day as admission  HPI: Pt admitted after midnight for evaluation of hypotension and generalized weakness, presentation concerning for possible sepsis, but without clear source of infection, in addition to epigastric / lower chest pain with Cardiology consulted. See H&P for full HPI on admission & ED course.   A&P: as per H&P by Dr. Franky, with any changes or additions as below:   --Cardiology consult reviewed, no ischemic evaluation felt warranted at this time.   --Follow pending echo report  For hypotension -- additional 500 cc bolus early this AM for MAP's staying below 65.  --BP improved on rounds --Continue IV fluids, additional boluses as needed --Monitor volume & respiratory status closely  Hypomagnesemia - Mg 1.6 --IV Mg sulfate to replace   Pulmonary nodules - seen on CTA chest, new from prior studies, concerning for metastatic disease given pt's history of laryngeal carcinoma. --follow up with oncology   No charge

## 2023-11-13 NOTE — Progress Notes (Signed)
 Patient arrived to the floor at approximately 1640 in stable condition from the ED. Alert and oriented x4. Family at bedside.

## 2023-11-13 NOTE — Plan of Care (Signed)
  Problem: Clinical Measurements: Goal: Ability to maintain clinical measurements within normal limits will improve Outcome: Progressing   Problem: Safety: Goal: Ability to remain free from injury will improve Outcome: Progressing   

## 2023-11-13 NOTE — Consult Note (Addendum)
 Cardiology Consultation   Patient ID: Ruben Conrad MRN: [id number]; DOB: 01-22-1941  Admit date: 11/12/2023 Date of Consult: 11/13/2023  PCP:  Sun, Vyvyan, MD   [facility] Providers Cardiologist:  None        Patient Profile: Ruben Conrad is a 83 y.o. male with a hx of HTN, HLD, urinary retention, emphysema, aortic atherosclerosis, essential tremor and subtle signs of parkinsonism, laryngeal carcinoma s/p radiation followed by Otolaryngology, MI, and CAD s/p PCI   who is being seen 11/13/2023 for the evaluation of epigastric and lower sternal chest pain at the request of Burnard Cunning DO.  History of Present Illness: Mr. Ruben Conrad is a 83 y.o. male who reportedly has CAD and previously received PCI. I am unable to see any documentation of this.   Patient presented to the emergency department for epigastric pain, and changes in mental status.  He was found to have an elevated lactic acid and leukocytosis and was started on antibiotics for sepsis.  On interview the patient reported that he woke up at about 2:30 in the morning with epigastric pain, dizziness, and chills.  The epigastric pain was worse when he took a deep breath.  Denies any ongoing epigastric pain.  Denies any shortness of breath nausea, vomiting, neck pain, shoulder pain, chest pain, orthopnea, and diaphoresis. 2 days ago was able to do about 15 or 20 minutes of moderate intensity exercise on a stationary bike.  He does have stability concerns.  Denies any recent shortness of breath or DOE.  Quit smoking about 20 years ago.  Denies any alcohol use or illicit substance use.  Reported that his prior MI was in [place]  and happened about 30 years ago.  Denies any history of heart failure, A-fib, or CVA.  Labs showed negative high-sensitivity troponins 7 > 7 , elevated lactic acid 2.1  > 1.9 , TSH of 0.744, potassium of 3.8, hypocalcemia of 8.1, hypomagnesemia of 1.6, hypoalbuminemia of 2.9, creatinine of  1.97, BUN of 12, leukocytosis of 11.2, and anemia with a hemoglobin of 12.8.  Respiratory panel was negative  Pulmonary CTA showed no evidence of PE, pulmonary nodules in right upper lobe that may represent metastatic carcinoma from laryngeal carcinoma, coronary calcifications, aortic atherosclerosis, and emphysema.  CT abdomen and pelvis showed no evidence of acute cholecystitis, infrarenal AAA measuring 3.1 cm, and severe atherosclerotic plaque.  Head CT showed no acute intracranial abnormality.  EKG showed normal sinus rhythm with a rate of 75 and a first-degree AV block.   Past Medical History:  Diagnosis Date   Cancer [facility])    head/neck   Coronary artery disease    with stents   History of radiation therapy 04/15/18- 05/22/18   Head and neck/ Larynx 28 fractions. 2.25 Gy each for total of 63 Gy.    Hypercholesteremia    Hypertension    Myocardial infarction (HCC)    Tremors of nervous system     Past Surgical History:  Procedure Laterality Date   APPENDECTOMY     CORONARY ANGIOPLASTY WITH STENT PLACEMENT     DIRECT LARYNGOSCOPY N/A 02/25/2018   Procedure: SUSPENDED DIRECT MICROLARYNGOSCOPY WITH CO2 LASER VOCAL CORD BIOPSY;  Surgeon: Carlie Clark, MD;  Location: [facility] OR;  Service: ENT;  Laterality: N/A;   HIP FRACTURE SURGERY Left 2018   SKIN SURGERY  10/2021     Home Medications:  Prior to Admission medications   Medication Sig Start Date End Date Taking? Authorizing Provider  aspirin 325 MG tablet Take 325  mg by mouth daily.   Yes [provider]  atenolol  (TENORMIN ) 50 MG tablet Take 50 mg by mouth daily. 09/25/17  Yes [provider]  atorvastatin  (LIPITOR) 20 MG tablet Take 20 mg by mouth daily.  09/25/17  Yes [provider]  Ca Phosphate-Cholecalciferol (CALTRATE GUMMY BITES) 250-10 MG-MCG CHEW Chew 2 each by mouth daily. 02/07/18  Yes [provider]  clopidogrel  (PLAVIX ) 75 MG tablet Take 75 mg by mouth daily.  11/22/17  Yes  [provider]  docusate sodium (COLACE) 100 MG capsule Take 200 mg by mouth daily.    Yes [provider]  famotidine (PEPCID) 20 MG tablet Take 20 mg by mouth daily.   Yes [provider]    Scheduled Meds:  aspirin  325 mg Oral Daily   atorvastatin   20 mg Oral Daily   clopidogrel   75 mg Oral Daily   enoxaparin (LOVENOX) injection  40 mg Subcutaneous Q24H   famotidine  20 mg Oral Daily   Continuous Infusions:  ceFEPime (MAXIPIME) IV Stopped (11/13/23 9077)   lactated ringers  75 mL/hr at 11/13/23 0406   magnesium sulfate bolus IVPB 2 g (11/13/23 1110)   [START ON 11/14/2023] vancomycin     PRN Meds:   Allergies:    Allergies  Allergen Reactions   Primidone  Hives   Propranolol Hives    Social History:   Social History   Socioeconomic History   Marital status: Widowed    Spouse name: Not on file   Number of children: 6   Years of education: 46   Highest education level: Not on file  Occupational History    Comment: retired, lineman  Tobacco Use   Smoking status: Former    Current packs/day: 0.00    Types: Cigarettes    Start date: 12/26/1961    Quit date: 12/27/2011    Years since quitting: 11.8   Smokeless tobacco: Never  Vaping Use   Vaping status: Never Used  Substance and Sexual Activity   Alcohol use: No   Drug use: Never   Sexual activity: Not on file  Other Topics Concern   Not on file  Social History Narrative   Lives alone, dgtr, [place] lives 3 miles away, 1 dgtr passed away   Caffeine- Diet Mtn Dew, 2-3 daily   Social Drivers of Health   Financial Resource Strain: Not on file  Food Insecurity: No Food Insecurity (11/13/2023)   Hunger Vital Sign    Worried About Running Out of Food in the Last Year: Never true    Ran Out of Food in the Last Year: Never true  Transportation Needs: No Transportation Needs (11/13/2023)   PRAPARE - Administrator, Civil Service (Medical): No    Lack of Transportation  (Non-Medical): No  Physical Activity: Not on file  Stress: Not on file  Social Connections: Socially Isolated (11/13/2023)   Social Connection and Isolation Panel    Frequency of Communication with Friends and Family: Once a week    Frequency of Social Gatherings with Friends and Family: Once a week    Attends Religious Services: Never    Database administrator or Organizations: No    Attends Banker Meetings: Never    Marital Status: Widowed  Intimate Partner Violence: Not At Risk (11/13/2023)   Humiliation, Afraid, Rape, and Kick questionnaire    Fear of Current or Ex-Partner: No    Emotionally Abused: No    Physically Abused: No  Sexually Abused: No    Family History:    Family History  Problem Relation Age of Onset   Cancer Father        prostate     ROS:  Please see the history of present illness.   All other ROS reviewed and negative.     Physical Exam/Data: Vitals:   11/13/23 0930 11/13/23 1000 11/13/23 1030 11/13/23 1100  BP: (!) 98/55 (!) 90/47 (!) 98/50 (!) 96/48  Pulse: (!) 53 (!) 51 (!) 57 (!) 48  Resp: 19 13 19 15   Temp:      TempSrc:      SpO2: 96% 98% 97% 94%  Weight:      Height:        Intake/Output Summary (Last 24 hours) at 11/13/2023 1122 Last data filed at 11/13/2023 1110 Gross per 24 hour  Intake 2632.12 ml  Output 1300 ml  Net 1332.12 ml      11/12/2023    8:25 PM 07/18/2022    1:58 PM 06/02/2022   12:13 AM  Last 3 Weights  Weight (lbs) 169 lb 171 lb 6.4 oz 179 lb  Weight (kg) 76.658 kg 77.747 kg 81.194 kg     Body mass index is 23.57 kg/m.  General:  Well nourished, well developed, in no acute distress.  Alert and orientated on room air. HEENT: normal Neck: no JVD Vascular: No carotid bruits; Distal pulses 2+ bilaterally Cardiac:  normal S1, S2; RRR; no murmur  Lungs:  clear to auscultation bilaterally, no wheezing, rhonchi or rales  Abd: soft, nontender, no hepatomegaly  Ext: no edema Musculoskeletal:  No  deformities. Skin: warm and dry  Neuro:  no focal abnormalities noted Psych:  Normal affect   EKG:  The EKG was personally reviewed and demonstrates:  normal sinus rhythm with a rate of 75 and a first-degree AV block. TWI in lead III similar to prior EKG in 2020. Telemetry:  Telemetry was personally reviewed and demonstrates:  Normal sinus rhythm with heart rates in the 50's to 70's  Relevant CV Studies: Echo pending  Laboratory Data: High Sensitivity Troponin:   Recent Labs  Lab 11/12/23 1830 11/12/23 2032  TROPONINIHS 7 7     Chemistry Recent Labs  Lab 11/12/23 1830 11/13/23 0255  NA 139 137  K 3.9 3.8  CL 107 108  CO2 23 22  GLUCOSE 117* 96  BUN 13 12  CREATININE 1.11 0.97  CALCIUM 9.3 8.1*  MG  --  1.6*  GFRNONAA >60 >60  ANIONGAP 9 7    Recent Labs  Lab 11/12/23 1830 11/13/23 0255  PROT 6.8 5.8*  ALBUMIN 3.2* 2.9*  AST 22 17  ALT 21 15  ALKPHOS 52 45  BILITOT 1.3* 1.4*   Lipids No results for input(s): CHOL, TRIG, HDL, LABVLDL, LDLCALC, CHOLHDL in the last 168 hours.  Hematology Recent Labs  Lab 11/12/23 1830 11/13/23 0255  WBC 12.8* 11.2*  RBC 3.94* 3.58*  HGB 14.0 12.8*  HCT 39.7 36.6*  MCV 100.8* 102.2*  MCH 35.5* 35.8*  MCHC 35.3 35.0  RDW 12.4 12.5  PLT 236 208   Thyroid   Recent Labs  Lab 11/13/23 0119  TSH 0.744    BNPNo results for input(s): BNP, PROBNP in the last 168 hours.  DDimer No results for input(s): DDIMER in the last 168 hours.  Radiology/Studies:  CT Angio Chest Pulmonary Embolism (PE) W or WO Contrast Result Date: 11/13/2023 CLINICAL DATA:  Generalized weakness EXAM: CT ANGIOGRAPHY CHEST WITH CONTRAST  TECHNIQUE: Multidetector CT imaging of the chest was performed using the standard protocol during bolus administration of intravenous contrast. Multiplanar CT image reconstructions and MIPs were obtained to evaluate the vascular anatomy. RADIATION DOSE REDUCTION: This exam was performed according to the  departmental dose-optimization program which includes automated exposure control, adjustment of the mA and/or kV according to patient size and/or use of iterative reconstruction technique. CONTRAST:  75mL OMNIPAQUE  IOHEXOL  350 MG/ML SOLN COMPARISON:  09/02/2018 FINDINGS: Cardiovascular: Heart is at the upper limits of normal in size. Coronary calcifications are noted. No aneurysmal dilatation of the aorta is seen although atherosclerotic calcifications are noted. Evaluation for dissection is somewhat limited due to the timing of the contrast bolus. The pulmonary artery shows a normal branching pattern bilaterally. No filling defect to suggest pulmonary embolism is noted. Mediastinum/Nodes: Thoracic inlet is within normal limits. No hilar or mediastinal adenopathy is noted. The esophagus as visualized is within normal limits. Lungs/Pleura: Lungs are well aerated bilaterally. Diffuse emphysematous changes are seen. Some bibasilar atelectatic changes are noted with evidence of inspissated material within the lower lobe bronchial tree. Scattered nodules are noted throughout the right lung particularly in the right upper lobe with the largest of these measuring 13 mm in greatest dimension. This is best seen on image number 64 of series 11. An adjacent somewhat bilobed nodule is noted which measures 11 mm in greatest dimension these were not present on the prior exam and must be viewed as neoplastic in nature. Upper Abdomen: Visualized upper abdomen is within normal limits. Musculoskeletal: Degenerative changes of the thoracic spine are seen. No rib abnormality is noted. Review of the MIP images confirms the above findings. IMPRESSION: No evidence of pulmonary emboli. New pulmonary nodules as described primarily within the right upper lobe. Given the patient's clinical history of head and neck carcinoma this likely represents metastatic disease. Further workup is recommended. Aortic Atherosclerosis (ICD10-I70.0) and  Emphysema (ICD10-J43.9). Electronically Signed   By: Oneil Devonshire M.D.   On: 11/13/2023 02:11   CT HEAD WO CONTRAST ( ) Result Date: 11/13/2023 EXAM: CT HEAD WITHOUT CONTRAST 11/13/2023 01:55:30 AM TECHNIQUE: CT of the head was performed without the administration of intravenous contrast. Automated exposure control, iterative reconstruction, and/or weight based adjustment of the mA/kV was utilized to reduce the radiation dose to as low as reasonably achievable. COMPARISON: None available. CLINICAL HISTORY: Mental status change, unknown cause. Patient presents because of weakness as well as some slight altered mental status some lethargy per family. No nausea or vomiting. No obvious fever but does endorse chills. No pleuritic chest pain or hemoptysis. No recent falls. Maybe some slight dysuria as well. FINDINGS: BRAIN AND VENTRICLES: Patchy and confluent areas of decreased attenuation are noted throughout the deep and periventricular white matter of the cerebral hemispheres bilaterally, suggestive of chronic microvascular ischemic changes. No acute hemorrhage. No evidence of acute infarct. No hydrocephalus. No extra-axial collection. No mass effect or midline shift. Atherosclerotic calcifications are present within the cavernous internal carotid and vertebral arteries. ORBITS: Bilateral lens replacement. SINUSES: No acute abnormality. SOFT TISSUES AND SKULL: No acute soft tissue abnormality. No skull fracture. IMPRESSION: 1. No acute intracranial abnormality. Electronically signed by: Morgane Naveau MD 11/13/2023 02:09 AM EDT RP Workstation: [id number]   US  Abdomen Limited RUQ (LIVER/GB) Result Date: 11/13/2023 EXAM: Right Upper Quadrant Abdominal Ultrasound 11/12/2023 11:55:13 PM TECHNIQUE: Real-time ultrasonography of the right upper quadrant of the abdomen was performed. COMPARISON: CT abdomen and pelvis 10:20:25 CLINICAL HISTORY: Abdominal pain. FINDINGS: LIVER: Increased hepatic  parenchyma  echogenicity. No intrahepatic biliary ductal dilatation. No evidence of mass. Portal vein is patent with normal directional flow. BILIARY SYSTEM: Calcified gallstones within the gallbladder lumen. No gallbladder wall thickening or pericholecystic fluid. Negative sonographic Murphy's sign. Common bile duct is within normal limits measuring  3 mm . RIGHT KIDNEY: The right kidney is grossly unremarkable in appearances without evidence of hydronephrosis, echogenic calculi or worrisome mass lesions. PANCREAS: Visualized portions of the pancreas are unremarkable. OTHER: No right upper quadrant ascites. IMPRESSION: 1. Cholelithiasis without sonographic evidence of acute cholecystitis. 2. Hepatic steatosis. Electronically signed by: Morgane Naveau MD 11/13/2023 12:00 AM EDT RP Workstation: [id number]   CT ABDOMEN PELVIS W CONTRAST Result Date: 11/12/2023 EXAM: CT ABDOMEN AND PELVIS WITH CONTRAST 11/12/2023 09:42:31 PM TECHNIQUE: CT of the abdomen and pelvis was performed with the administration of 75 mL iohexol  (OMNIPAQUE ) 350 MG/ML injection. Multiplanar reformatted images are provided for review. Automated exposure control, iterative reconstruction, and/or weight-based adjustment of the mA/kV was utilized to reduce the radiation dose to as low as reasonably achievable. COMPARISON: None available. CLINICAL HISTORY: Epigastric abdominal pain with nausea. Sepsis workup, weakness, AMS. FINDINGS: LOWER CHEST: 4 x 5 mm subpleural right lower lobe pulmonary nodule. Bilateral lower lobe atelectasis. Coronary artery calcifications. LIVER: The liver is unremarkable. GALLBLADDER AND BILE DUCTS: Cholelithiasis. No gallbladder wall thickening or pericholecystic fluid. No biliary ductal dilatation. SPLEEN: No acute abnormality. PANCREAS: No acute abnormality. ADRENAL GLANDS: No acute abnormality. KIDNEYS, URETERS AND BLADDER: Fluid density lesion of the left kidney likely represents simple renal cysts. Per consensus, no follow-up  is needed for simple Bosniak type 1 and 2 renal cysts, unless the patient has a malignancy history or risk factors. No filling defects of the partially visualized collecting systems on delayed imaging. No stones in the kidneys or ureters. No hydronephrosis. No perinephric or periureteral stranding. Urinary bladder is unremarkable. GI AND BOWEL: No small or large bowel wall thickening or dilatation. Stomach demonstrates no acute abnormality. The appendix is not definitely identified with no inflammatory changes in the right lower quadrant to suggest acute appendicitis. PERITONEUM AND RETROPERITONEUM: No ascites. No free air. VASCULATURE: Severe atherosclerotic plaque. Infrarenal abdominal aorta aneurysm measuring up to 3.1 cm. LYMPH NODES: No lymphadenopathy. REPRODUCTIVE ORGANS: No acute abnormality. BONES AND SOFT TISSUES: Partially visualized intramedullary nail fixation of the left femur. No acute osseous abnormality. No focal soft tissue abnormality. IMPRESSION: 1. No acute findings in the abdomen or pelvis. 2. Cholelithiasis with no CT evidence of acute cholecystitis. 3. Infrarenal abdominal aortic aneurysm measuring up to 3.1 cm.  Recommend follow-up ultrasound every 3 years. (Ref.: J Vasc Surg. 2018; 67:2-77 and J Am Coll Radiol 2013;10(10):789-794.)  4. Severe atherosclerotic plaque. 5. Right lower lobe pulmonary micronodule. No further follow-up indicated unless the patient high risk - an optional CT can be obtained at 12 months. Electronically signed by: Morgane Naveau MD 11/12/2023 10:04 PM EDT RP Workstation: [id number]   DG Chest Port 1 View Result Date: 11/12/2023 CLINICAL DATA:  Questionable sepsis. EXAM: PORTABLE CHEST 1 VIEW COMPARISON:  Chest CT from 2020 FINDINGS: The cardiac silhouette, mediastinal and hilar contours are within normal limits. Stable mild tortuosity and calcification of the thoracic aorta. The lungs are clear of an acute process. Streaky left basilar scarring changes again  noted. No pleural effusions or pulmonary lesions. IMPRESSION: No acute cardiopulmonary findings. Electronically Signed   By: MYRTIS Stammer M.D.   On: 11/12/2023 18:39     Assessment and Plan: Ruben Conrad is a 83  y.o. male with a hx of HTN, HLD, urinary retention, emphysema, aortic atherosclerosis, essential tremor, laryngeal carcinoma s/p radiation followed by Otolaryngology, MI, and CAD s/p PCI   who is being seen 11/13/2023 for the evaluation of epigastric and lower sternal chest pain at the request of Burnard Cunning DO.  Epigastric pain Suspected sepsis CAD with a remote history of PCI 30 years ago Hyperlipidema Had epigastric pain that woke him up at about 2:30 AM.  Denies any ongoing epigastric pain.  Blood pressures have been hypotensive and patient was started on IV lactated Ringer 's. Labs showed leukocytosis and elevated lactic acid.  Patient was started on IV ceftriaxone and IV vancomycin. Suspect hypotension is due to sepsis. Management per primary negative high-sensitivity troponins 7 > 7 EKG showed normal sinus rhythm with a rate of 75, a first-degree AV block,  TWI in lead III similar to prior EKG in 2020, slight lateral ST elevation seen on prior EKG in 2020, and no acute ischemic changes. PTA was on asprin 324mg  and plavix  75mg  daily If Echo is reassuring no further ischemic evaluation is warranted at this time. Dr Court reviewed echo and felt like LVEF was normal. Echo pending. Recommend decreasing aspirin to 81 mg daily and stopping Plavix . Continue atorvastatin  20 mg daily   Laryngeal carcinoma s/p radiation followed by Otolaryngology Pulmonary CTA showed no evidence of PE, pulmonary nodules in right upper lobe that may represent metastatic carcinoma from laryngeal carcinoma, coronary calcifications, aortic atherosclerosis, and emphysema. Management per primary   Urinary retention Requires frequent catheterization.   Otherwise management per primary   Risk  Assessment/Risk Scores:         [facility] will sign off.   The patient is ready for discharge today from a cardiac standpoint. Medication Recommendations:  as above Other recommendations (labs, testing, etc):  none Follow up as an outpatient:  as needed.  For questions or updates, please contact [facility] Please consult [email or web address] for contact info under     Signed, Morse Clause, PA-C  11/13/2023 11:22 AM  Agree with note by Morse Clause, PA-C  We were asked to see this pleasant 83 year old gentleman with prior history of PCI 30 years ago that had epigastric pain but denies chest pain.  He is fairly active for his age and exercises on a recumbent bike without symptoms.  His enzymes are low and flat.  His EKG shows no acute changes.  2D echo showed normal LV function.  I do not think his symptoms are ischemically mediated but most likely gastrointestinal.  We will sign off.  No further follow-up is warranted.  The primary team can pursue a further GI evaluation.  Dorn DOROTHA Court, M.D., FACP, [facility], FAHA, [facility]  [address], [address] [place], [place]  [address]  [phone] 11/13/2023 1:23 PM

## 2023-11-13 NOTE — Progress Notes (Signed)
 Pharmacy Antibiotic Note  Mattix Imhof is a 83 y.o. male admitted on 11/12/2023 with possible sepsis.  Pharmacy has been consulted for Vancomycin and Cefepime  dosing.  Plan: Vancomycin 1500 mg IV now, then 1000 mg IV q24h Cefepime 2 g IV q12h  Height: 5' 11 (180.3 cm) Weight: 76.7 kg (169 lb) IBW/kg (Calculated) : 75.3  Temp (24hrs), Avg:98.7 F (37.1 C), Min:98.6 F (37 C), Max:98.8 F (37.1 C)  Recent Labs  Lab 11/12/23 1830 11/12/23 1842 11/12/23 2114  WBC 12.8*  --   --   CREATININE 1.11  --   --   LATICACIDVEN  --  2.1* 1.9    Estimated Creatinine Clearance: 52.8 mL/min (by C-G formula based on SCr of 1.11 mg/dL).    Allergies  Allergen Reactions   Primidone  Hives   Propranolol Hives     Clarise Chacko Vernon 11/13/2023 2:56 AM

## 2023-11-13 NOTE — ED Notes (Signed)
 Echo at bedside

## 2023-11-14 DIAGNOSIS — I959 Hypotension, unspecified: Secondary | ICD-10-CM | POA: Diagnosis not present

## 2023-11-14 LAB — CBC
HCT: 36.3 % — ABNORMAL LOW (ref 39.0–52.0)
Hemoglobin: 12.8 g/dL — ABNORMAL LOW (ref 13.0–17.0)
MCH: 35.4 pg — ABNORMAL HIGH (ref 26.0–34.0)
MCHC: 35.3 g/dL (ref 30.0–36.0)
MCV: 100.3 fL — ABNORMAL HIGH (ref 80.0–100.0)
Platelets: 220 K/uL (ref 150–400)
RBC: 3.62 MIL/uL — ABNORMAL LOW (ref 4.22–5.81)
RDW: 12.3 % (ref 11.5–15.5)
WBC: 9.2 K/uL (ref 4.0–10.5)
nRBC: 0 % (ref 0.0–0.2)

## 2023-11-14 LAB — BASIC METABOLIC PANEL WITH GFR
Anion gap: 7 (ref 5–15)
BUN: 10 mg/dL (ref 8–23)
CO2: 23 mmol/L (ref 22–32)
Calcium: 8.1 mg/dL — ABNORMAL LOW (ref 8.9–10.3)
Chloride: 109 mmol/L (ref 98–111)
Creatinine, Ser: 1.03 mg/dL (ref 0.61–1.24)
GFR, Estimated: >60 mL/min (ref >60)
Glucose, Bld: 96 mg/dL (ref 70–99)
Potassium: 3.3 mmol/L — ABNORMAL LOW (ref 3.5–5.1)
Sodium: 139 mmol/L (ref 135–145)

## 2023-11-14 LAB — MAGNESIUM: Magnesium: 2.1 mg/dL (ref 1.7–2.4)

## 2023-11-14 MED ORDER — POTASSIUM CHLORIDE 20 MEQ PO PACK
40.0000 meq | PACK | Freq: Once | ORAL | Status: AC
  Administered 2023-11-14: 40 meq via ORAL
  Filled 2023-11-14: qty 2

## 2023-11-14 NOTE — Plan of Care (Signed)
  Problem: Clinical Measurements: Goal: Ability to maintain clinical measurements within normal limits will improve Outcome: Progressing   Problem: Clinical Measurements: Goal: Diagnostic test results will improve Outcome: Progressing   Problem: Safety: Goal: Ability to remain free from injury will improve Outcome: Progressing   

## 2023-11-14 NOTE — TOC Initial Note (Signed)
 Transition of Care [facility]) - Initial/Assessment Note    Patient Details  Name: Ruben Conrad MRN: [id number] Date of Birth: [birth year]  [facility] CM/SW Contact:    Jeoffrey LITTIE Maranda ISRAEL Phone Number: 11/14/2023, 8:41 AM  Clinical Narrative:                 Pt admitted from home due to weakness and confusion. No current TOC needs, please consult as needs arise.         Patient Goals and CMS Choice            Expected Discharge Plan and Services                                              Prior Living Arrangements/Services                       Activities of Daily Living   ADL Screening (condition at time of admission) Independently performs ADLs?: Yes (appropriate for developmental age) Is the patient deaf or have difficulty hearing?: No Does the patient have difficulty seeing, even when wearing glasses/contacts?: No Does the patient have difficulty concentrating, remembering, or making decisions?: No  Permission Sought/Granted                  Emotional Assessment              Admission diagnosis:  Lactic acidosis [E87.20] Epigastric abdominal pain [R10.13] Lethargy [R53.83] Calculus of gallbladder without cholecystitis without obstruction [K80.20] Hypotension [I95.9] Leukocytosis, unspecified type [D72.829] Patient Active Problem List   Diagnosis Date Noted   Epigastric pain 11/13/2023   CAD S/P percutaneous coronary angioplasty 11/13/2023   Gallstones 11/13/2023   Epigastric abdominal pain 11/13/2023   Hypotension 11/13/2023   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   Essential tremor 12/26/2017   PCP:  Sun, Vyvyan, MD Pharmacy:   [id number] - [place], [place] - [address] AT [facility] [address] [place] [address] Phone: [phone] Fax: [phone]     Social Drivers of Health (SDOH) Social History: SDOH Screenings   Food  Insecurity: No Food Insecurity (11/13/2023)  Housing: Low Risk  (11/13/2023)  Transportation Needs: No Transportation Needs (11/13/2023)  Utilities: Not At Risk (11/13/2023)  Social Connections: Socially Isolated (11/13/2023)  Tobacco Use: Medium Risk (11/13/2023)   SDOH Interventions:     Readmission Risk Interventions     No data to display

## 2023-11-14 NOTE — Progress Notes (Signed)
 PROGRESS NOTE  [name] FMW:[id number] DOB: 04/28/1940 DOA: 11/12/2023 PCP: Sun, Vyvyan, MD   LOS: 1 day   Brief Narrative / Interim history: 83 year old male with history of CAD/PCI, laryngeal carcinoma s/p radiation follows with ENT as an outpatient, HTN came into the hospital with epigastric and lower chest pain.  He was also complaining of generalized weakness and almost had a syncopal episode.  He was also apparently slightly confused.  He was hypotensive in the ER, had a white count so there was initially concern for infection.  He had a urinary retention having to have an In-N-Out cath.  Extensive imaging was fairly unrevealing, he was started empirically on antibiotics.  Subjective / 24h Interval events: He feels well today.  Asking about going home  Assesement and Plan: Principal problem Hypotension, weakness -recent is not entirely clear, he has had no recent change in medication, but given leukocytosis and lactic acidosis he was empirically started on antibiotics.  Continue for now, monitor cultures.  He is responding to fluids and his blood pressure is improved  Active problems Coronary artery disease, history of PCI, chest pain on admission-cardiac markers were negative.  Cardiology consulted and evaluated patient, did not recommend any further workup.  His chest pain has resolved.  2D echocardiogram 10/21 showed normal LVEF at 60-65%, concentric LVH, related to diastolic dysfunction, normal RV systolic function and size  Gallstones-could have been causing patient's epigastric discomfort, however he did not have nausea, vomiting, any relationship to the food and pain was central rather than in the right upper quadrant, so this is less likely.  Ultrasound of the abdomen without any significant signs of cholecystitis.  LFTs unremarkable  Hypokalemia-replenish potassium and continue to monitor  Mild anemia of chronic disease-hemoglobin stable, no  bleeding  Leukocytosis-white count normalized with antibiotics  History of laryngeal carcinoma-chest CT with concern for metastatic disease, outpatient follow-up  Scheduled Meds:  aspirin  325 mg Oral Daily   atorvastatin   20 mg Oral Daily   clopidogrel   75 mg Oral Daily   famotidine  20 mg Oral Daily   Continuous Infusions:  ceFEPime (MAXIPIME) IV 2 g (11/14/23 0941)   vancomycin 1,250 mg (11/14/23 0954)   PRN Meds:.  Current Outpatient Medications  Medication Instructions   aspirin 325 mg, Oral, Daily   atenolol  (TENORMIN ) 50 mg, Oral, Daily   atorvastatin  (LIPITOR) 20 mg, Oral, Daily   Ca Phosphate-Cholecalciferol (CALTRATE GUMMY BITES) 250-10 MG-MCG CHEW 2 each, Oral, Daily   clopidogrel  (PLAVIX ) 75 mg, Oral, Daily   docusate sodium (COLACE) 200 mg, Oral, Daily   famotidine (PEPCID) 20 mg, Oral, Daily    Diet Orders (From admission, onward)     Start     Ordered   11/13/23 1355  Diet heart healthy/carb modified Room service appropriate? Yes; Fluid consistency: Thin  Diet effective now       Question Answer Comment  Diet-HS Snack? Nothing   Room service appropriate? Yes   Fluid consistency: Thin      11/13/23 1354            DVT prophylaxis:    Lab Results  Component Value Date   PLT 220 11/14/2023      Code Status: Full Code  Family Communication: Daughter present at bedside  Status is: Inpatient Remains inpatient appropriate because: Monitoring cultures, antibiotics   Level of care: Telemetry Medical  Consultants:  None  Objective: Vitals:   11/13/23 1600 11/13/23 1710 11/13/23 2142 11/14/23 0519  BP: (!) 101/56  108/60 114/60 (!) 97/56  Pulse:  96 67 (!) 56  Resp:  18  16  Temp:  98.2 F (36.8 C) (!) 97.5 F (36.4 C) 98 F (36.7 C)  TempSrc:   Oral Oral  SpO2:  97% 95% 94%  Weight:      Height:        Intake/Output Summary (Last 24 hours) at 11/14/2023 1012 Last data filed at 11/13/2023 2326 Gross per 24 hour  Intake 1258.38  ml  Output 800 ml  Net 458.38 ml   Wt Readings from Last 3 Encounters:  11/12/23 76.7 kg  07/18/22 77.7 kg  06/02/22 81.2 kg    Examination:  Constitutional: NAD Eyes: no scleral icterus ENMT: Mucous membranes are moist.  Neck: normal, supple Respiratory: clear to auscultation bilaterally, no wheezing, no crackles.  Cardiovascular: Regular rate and rhythm, no murmurs / rubs / gallops. No LE edema.  Abdomen: non distended, no tenderness. Bowel sounds positive.  Musculoskeletal: no clubbing / cyanosis.    Data Reviewed: I have independently reviewed following labs and imaging studies   CBC Recent Labs  Lab 11/12/23 1830 11/13/23 0255 11/14/23 0400  WBC 12.8* 11.2* 9.2  HGB 14.0 12.8* 12.8*  HCT 39.7 36.6* 36.3*  PLT 236 208 220  MCV 100.8* 102.2* 100.3*  MCH 35.5* 35.8* 35.4*  MCHC 35.3 35.0 35.3  RDW 12.4 12.5 12.3  LYMPHSABS 1.7 2.5  --   MONOABS 1.7* 1.7*  --   EOSABS 0.0 0.1  --   BASOSABS 0.1 0.1  --     Recent Labs  Lab 11/12/23 1830 11/12/23 1842 11/12/23 2114 11/13/23 0119 11/13/23 0255 11/14/23 0400  NA 139  --   --   --  137 139  K 3.9  --   --   --  3.8 3.3*  CL 107  --   --   --  108 109  CO2 23  --   --   --  22 23  GLUCOSE 117*  --   --   --  96 96  BUN 13  --   --   --  12 10  CREATININE 1.11  --   --   --  0.97 1.03  CALCIUM 9.3  --   --   --  8.1* 8.1*  AST 22  --   --   --  17  --   ALT 21  --   --   --  15  --   ALKPHOS 52  --   --   --  45  --   BILITOT 1.3*  --   --   --  1.4*  --   ALBUMIN 3.2*  --   --   --  2.9*  --   MG  --   --   --   --  1.6* 2.1  LATICACIDVEN  --  2.1* 1.9  --   --   --   INR 1.2  --   --   --   --   --   TSH  --   --   --  0.744  --   --     ------------------------------------------------------------------------------------------------------------------ No results for input(s): CHOL, HDL, LDLCALC, TRIG, CHOLHDL, LDLDIRECT in the last 72 hours.  No results found for:  HGBA1C ------------------------------------------------------------------------------------------------------------------ Recent Labs    11/13/23 0119  TSH 0.744    Cardiac Enzymes No results for input(s): CKMB, TROPONINI, MYOGLOBIN in the last 168 hours.  Invalid  input(s): CK ------------------------------------------------------------------------------------------------------------------ No results found for: BNP  CBG: Recent Labs  Lab 11/13/23 0133  GLUCAP 95    Recent Results (from the past 240 hours)  Blood Culture (routine x 2)     Status: None (Preliminary result)   Collection Time: 11/12/23  6:10 PM   Specimen: BLOOD  Result Value Ref Range Status   Specimen Description BLOOD SITE NOT SPECIFIED  Final   Special Requests   Final    BOTTLES DRAWN AEROBIC AND ANAEROBIC Blood Culture results may not be optimal due to an inadequate volume of blood received in culture bottles   Culture   Final    NO GROWTH 2 DAYS Performed at [facility], [address] [place], [place] [address]    Report Status PENDING  Incomplete  Blood Culture (routine x 2)     Status: None (Preliminary result)   Collection Time: 11/12/23  6:31 PM   Specimen: BLOOD  Result Value Ref Range Status   Specimen Description BLOOD SITE NOT SPECIFIED  Final   Special Requests   Final    BOTTLES DRAWN AEROBIC AND ANAEROBIC Blood Culture results may not be optimal due to an inadequate volume of blood received in culture bottles   Culture   Final    NO GROWTH 2 DAYS Performed at [facility], [address] [place], [place] [address]    Report Status PENDING  Incomplete  Resp panel by RT-PCR (RSV, Flu A&B, Covid) Anterior Nasal Swab     Status: None   Collection Time: 11/12/23 10:47 PM   Specimen: Anterior Nasal Swab  Result Value Ref Range Status   SARS Coronavirus 2 by RT PCR NEGATIVE NEGATIVE Final   Influenza A by PCR NEGATIVE NEGATIVE Final   Influenza B by PCR NEGATIVE  NEGATIVE Final    Comment: (NOTE) The Xpert Xpress SARS-CoV-2/FLU/RSV plus assay is intended as an aid in the diagnosis of influenza from Nasopharyngeal swab specimens and should not be used as a sole basis for treatment. Nasal washings and aspirates are unacceptable for Xpert Xpress SARS-CoV-2/FLU/RSV testing.  Fact Sheet for Patients: BloggerCourse.com  Fact Sheet for Healthcare Providers: SeriousBroker.it  This test is not yet approved or cleared by the [place]  FDA and has been authorized for detection and/or diagnosis of SARS-CoV-2 by FDA under an Emergency Use Authorization (EUA). This EUA will remain in effect (meaning this test can be used) for the duration of the COVID-19 declaration under Section 564(b)(1) of the Act, 21 U.S.C. section 360bbb-3(b)(1), unless the authorization is terminated or revoked.     Resp Syncytial Virus by PCR NEGATIVE NEGATIVE Final    Comment: (NOTE) Fact Sheet for Patients: BloggerCourse.com  Fact Sheet for Healthcare Providers: SeriousBroker.it  This test is not yet approved or cleared by the [place]  FDA and has been authorized for detection and/or diagnosis of SARS-CoV-2 by FDA under an Emergency Use Authorization (EUA). This EUA will remain in effect (meaning this test can be used) for the duration of the COVID-19 declaration under Section 564(b)(1) of the Act, 21 U.S.C. section 360bbb-3(b)(1), unless the authorization is terminated or revoked.  Performed at [facility], [address], [place], [place] [address]      Radiology Studies: ECHOCARDIOGRAM COMPLETE Result Date: 11/13/2023    ECHOCARDIOGRAM REPORT   Patient Name:   Ruben Conrad Date of Exam: 11/13/2023 Medical Rec #:  [id number]        Height:  71.0 in Accession #:    [id number]       Weight:       169.0 lb Date of Birth:  1940-05-18        BSA:           1.963 m Patient Age:    83 years         BP:           90/47 mmHg Patient Gender: M                HR:           52 bpm. Exam Location:  Inpatient Procedure: 2D Echo, Cardiac Doppler and Color Doppler (Both Spectral and Color            Flow Doppler were utilized during procedure). Indications:    Chest Pain  History:        Patient has no prior history of Echocardiogram examinations.                 Previous Myocardial Infarction and CAD, Signs/Symptoms:Chest                 Pain and Hypotension; Risk Factors:Hypertension.  Sonographer:    Juliene Rucks Referring Phys: [phone] REDIA LOISE CLEAVER  Sonographer Comments: Suboptimal subcostal window. IMPRESSIONS  1. Left ventricular ejection fraction, by estimation, is 60 to 65%. The left ventricle has normal function. The left ventricle has no regional wall motion abnormalities. There is mild concentric left ventricular hypertrophy. Left ventricular diastolic parameters are consistent with Grade II diastolic dysfunction (pseudonormalization). Elevated left atrial pressure.  2. Right ventricular systolic function is normal. The right ventricular size is normal.  3. Left atrial size was mildly dilated.  4. The mitral valve is normal in structure. No evidence of mitral valve regurgitation. No evidence of mitral stenosis. Moderate mitral annular calcification.  5. The aortic valve is tricuspid. There is mild calcification of the aortic valve. Aortic valve regurgitation is not visualized. Aortic valve sclerosis/calcification is present, without any evidence of aortic stenosis. FINDINGS  Left Ventricle: Left ventricular ejection fraction, by estimation, is 60 to 65%. The left ventricle has normal function. The left ventricle has no regional wall motion abnormalities. The left ventricular internal cavity size was normal in size. There is  mild concentric left ventricular hypertrophy. Left ventricular diastolic parameters are consistent with Grade II diastolic dysfunction  (pseudonormalization). Elevated left atrial pressure. Right Ventricle: The right ventricular size is normal. No increase in right ventricular wall thickness. Right ventricular systolic function is normal. Left Atrium: Left atrial size was mildly dilated. Right Atrium: Right atrial size was normal in size. Prominent Eustachian valve. Pericardium: There is no evidence of pericardial effusion. Mitral Valve: The mitral valve is normal in structure. Moderate mitral annular calcification. No evidence of mitral valve regurgitation. No evidence of mitral valve stenosis. Tricuspid Valve: The tricuspid valve is normal in structure. Tricuspid valve regurgitation is not demonstrated. No evidence of tricuspid stenosis. Aortic Valve: The aortic valve is tricuspid. There is mild calcification of the aortic valve. Aortic valve regurgitation is not visualized. Aortic valve sclerosis/calcification is present, without any evidence of aortic stenosis. Pulmonic Valve: The pulmonic valve was normal in structure. Pulmonic valve regurgitation is not visualized. No evidence of pulmonic stenosis. Aorta: The aortic root is normal in size and structure. Venous: The inferior vena cava was not well visualized. IAS/Shunts: No atrial level shunt detected by color flow Doppler.  LEFT VENTRICLE PLAX 2D LVIDd:  4.40 cm      Diastology LVIDs:         3.60 cm      LV e' medial:    5.11 cm/s LV PW:         1.20 cm      LV E/e' medial:  17.8 LV IVS:        1.20 cm      LV e' lateral:   5.55 cm/s LVOT diam:     2.16 cm      LV E/e' lateral: 16.4 LV SV:         90 LV SV Index:   46 LVOT Area:     3.66 cm  LV Volumes (MOD) LV vol d, MOD A2C: 127.0 ml LV vol d, MOD A4C: 94.7 ml LV vol s, MOD A2C: 53.2 ml LV vol s, MOD A4C: 38.4 ml LV SV MOD A2C:     73.8 ml LV SV MOD A4C:     94.7 ml LV SV MOD BP:      64.5 ml RIGHT VENTRICLE RV Basal diam:  3.40 cm RV Mid diam:    3.20 cm LEFT ATRIUM             Index LA Vol (A2C):   69.1 ml 35.20 ml/m LA Vol  (A4C):   47.3 ml 24.10 ml/m LA Biplane Vol: 57.6 ml 29.34 ml/m  AORTIC VALVE LVOT Vmax:   112.13 cm/s LVOT Vmean:  73.603 cm/s LVOT VTI:    0.247 m MV E velocity: 90.80 cm/s MV A velocity: 107.21 cm/s  SHUNTS MV E/A ratio:  0.85         Systemic VTI:  0.25 m                             Systemic Diam: 2.16 cm Jerel Croitoru MD Electronically signed by Jerel Balding MD Signature Date/Time: 11/13/2023/2:38:54 PM    Final      Nilda Fendt, MD, PhD [facility]  Between 7 am - 7 pm I am available, please contact me via Amion (for emergencies) or Securechat (non urgent messages)  Between 7 pm - 7 am I am not available, please contact night coverage MD/APP via Amion

## 2023-11-15 DIAGNOSIS — I959 Hypotension, unspecified: Secondary | ICD-10-CM | POA: Diagnosis not present

## 2023-11-15 LAB — COMPREHENSIVE METABOLIC PANEL WITH GFR
ALT: 22 U/L (ref 0–44)
AST: 25 U/L (ref 15–41)
Albumin: 2.8 g/dL — ABNORMAL LOW (ref 3.5–5.0)
Alkaline Phosphatase: 44 U/L (ref 38–126)
Anion gap: 9 (ref 5–15)
BUN: 8 mg/dL (ref 8–23)
CO2: 21 mmol/L — ABNORMAL LOW (ref 22–32)
Calcium: 8.1 mg/dL — ABNORMAL LOW (ref 8.9–10.3)
Chloride: 110 mmol/L (ref 98–111)
Creatinine, Ser: 0.91 mg/dL (ref 0.61–1.24)
GFR, Estimated: >60 mL/min (ref >60)
Glucose, Bld: 102 mg/dL — ABNORMAL HIGH (ref 70–99)
Potassium: 3.5 mmol/L (ref 3.5–5.1)
Sodium: 140 mmol/L (ref 135–145)
Total Bilirubin: 0.8 mg/dL (ref 0.0–1.2)
Total Protein: 6.1 g/dL — ABNORMAL LOW (ref 6.5–8.1)

## 2023-11-15 LAB — CBC
HCT: 36.6 % — ABNORMAL LOW (ref 39.0–52.0)
Hemoglobin: 13 g/dL (ref 13.0–17.0)
MCH: 35.5 pg — ABNORMAL HIGH (ref 26.0–34.0)
MCHC: 35.5 g/dL (ref 30.0–36.0)
MCV: 100 fL (ref 80.0–100.0)
Platelets: 208 K/uL (ref 150–400)
RBC: 3.66 MIL/uL — ABNORMAL LOW (ref 4.22–5.81)
RDW: 12.3 % (ref 11.5–15.5)
WBC: 8.6 K/uL (ref 4.0–10.5)
nRBC: 0 % (ref 0.0–0.2)

## 2023-11-15 LAB — MAGNESIUM: Magnesium: 1.8 mg/dL (ref 1.7–2.4)

## 2023-11-15 MED ORDER — CEFADROXIL 500 MG PO CAPS: 500.0000 mg | ORAL_CAPSULE | Freq: Two times a day (BID) | ORAL | 0 refills | Status: AC

## 2023-11-15 MED ORDER — SODIUM CHLORIDE 0.9 % IV BOLUS
500.0000 mL | Freq: Once | INTRAVENOUS | Status: AC
  Administered 2023-11-15: 500 mL via INTRAVENOUS

## 2023-11-15 NOTE — Progress Notes (Signed)
 Mobility Specialist Progress Note:    11/15/23 1122  Orthostatic Sitting  BP- Sitting 124/65  Pulse- Sitting 56  Orthostatic Standing at 0 minutes  BP- Standing at 0 minutes 124/65  Pulse- Standing at 0 minutes 63  Orthostatic Standing at 3 minutes  BP- Standing at 3 minutes 107/66  Pulse- Standing at 3 minutes 58  Mobility  Activity Ambulated with assistance (In hallway)  Level of Assistance Contact guard assist, steadying assist  Assistive Device Front wheel walker  Distance Ambulated (ft) 150 ft  Activity Response Tolerated well  Mobility Referral Yes  Mobility visit 1 Mobility  Mobility Specialist Start Time (ACUTE ONLY) 1102  Mobility Specialist Stop Time (ACUTE ONLY) 1121  Mobility Specialist Time Calculation (min) (ACUTE ONLY) 19 min   Received pt's BR light, requesting assistance to chair. Pt agreeable to mobility. Required MinG for safety. Acquired orthostatics on pt (see above). No c/o. Returned to room without fault. Left pt in chair with alarm on. Personal belongings and call light within reach. All needs met.  Lavanda Pollack Mobility Specialist  Please contact via [employer] or  Rehab Office [phone]

## 2023-11-15 NOTE — Discharge Summary (Signed)
 Physician Discharge Summary  Ruben Conrad FMW:[id number] DOB: 1940/02/04 DOA: 11/12/2023  PCP: Sun, Vyvyan, MD  Admit date: 11/12/2023 Discharge date: 11/15/2023  Admitted From: home Disposition:  home  Recommendations for Outpatient Follow-up:  Follow up with PCP in 1-2 weeks  Home Health: none Equipment/Devices: none  Discharge Condition: stable CODE STATUS: Full code Diet Orders (From admission, onward)     Start     Ordered   11/13/23 1355  Diet heart healthy/carb modified Room service appropriate? Yes; Fluid consistency: Thin  Diet effective now       Question Answer Comment  Diet-HS Snack? Nothing   Room service appropriate? Yes   Fluid consistency: Thin      11/13/23 1354            Brief Narrative / Interim history: 83 year old male with history of CAD/PCI, laryngeal carcinoma s/p radiation follows with ENT as an outpatient, HTN came into the hospital with epigastric and lower chest pain.  He was also complaining of generalized weakness and almost had a syncopal episode.  He was also apparently slightly confused.  He was hypotensive in the ER, had a white count so there was initially concern for infection.  He had a urinary retention having to have an In-N-Out cath.  Extensive imaging was fairly unrevealing, he was started empirically on antibiotics.  Hospital Course / Discharge diagnoses: Principal problem Hypotension, weakness -recent is not entirely clear, he has had no recent change in medication, but given leukocytosis and lactic acidosis he was empirically started on antibiotics.  Cultures have remained negative, but given clinical improvement and resolution of his white count with antibiotics, favor to treat empirically for a total of 5 days.  He will be narrowed to oral antibiotics on discharge   Active problems Coronary artery disease, history of PCI, chest pain on admission-cardiac markers were negative.  Cardiology consulted and evaluated patient,  did not recommend any further workup.  His chest pain has resolved.  2D echocardiogram 10/21 showed normal LVEF at 60-65%, concentric LVH, related to diastolic dysfunction, normal RV systolic function and size Gallstones-could have been causing patient's epigastric discomfort, however he did not have nausea, vomiting, any relationship to the food and pain was central rather than in the right upper quadrant, so this is less likely.  Ultrasound of the abdomen without any significant signs of cholecystitis.  LFTs unremarkable Essential hypertension, hypotension-patient has been having low blood pressures even at home for a while.  He gets dizzy.  He was maintained off atenolol  here, and has remained normotensive, recommend holding atenolol  upon discharge and continue ambulatory BP monitoring Hypokalemia-replenish potassium Mild anemia of chronic disease-hemoglobin stable, no bleeding Leukocytosis-white count normalized with antibiotics History of laryngeal carcinoma-chest CT with concern for metastatic disease, outpatient follow-up  Sepsis ruled out   Discharge Instructions   Allergies as of 11/15/2023       Reactions   Primidone  Hives   Propranolol Hives        Medication List     STOP taking these medications    atenolol  50 MG tablet Commonly known as: TENORMIN        TAKE these medications    aspirin 325 MG tablet Take 325 mg by mouth daily.   atorvastatin  20 MG tablet Commonly known as: LIPITOR Take 20 mg by mouth daily.   Caltrate Gummy Bites 250-10 MG-MCG Chew Generic drug: Ca Phosphate-Cholecalciferol Chew 2 each by mouth daily.   cefadroxil 500 MG capsule Commonly known as: DURICEF Take 1  capsule (500 mg total) by mouth 2 (two) times daily for 3 days.   clopidogrel  75 MG tablet Commonly known as: PLAVIX  Take 75 mg by mouth daily.   docusate sodium 100 MG capsule Commonly known as: COLACE Take 200 mg by mouth daily.   famotidine 20 MG tablet Commonly  known as: PEPCID Take 20 mg by mouth daily.       Consultations: Cardiology   Procedures/Studies:  ECHOCARDIOGRAM COMPLETE Result Date: 11/13/2023    ECHOCARDIOGRAM REPORT   Patient Name:   Ruben Conrad Date of Exam: 11/13/2023 Medical Rec #:  [id number]        Height:       71.0 in Accession #:    [id number]       Weight:       169.0 lb Date of Birth:  10-Dec-1940        BSA:          1.963 m Patient Age:    83 years         BP:           90/47 mmHg Patient Gender: M                HR:           52 bpm. Exam Location:  Inpatient Procedure: 2D Echo, Cardiac Doppler and Color Doppler (Both Spectral and Color            Flow Doppler were utilized during procedure). Indications:    Chest Pain  History:        Patient has no prior history of Echocardiogram examinations.                 Previous Myocardial Infarction and CAD, Signs/Symptoms:Chest                 Pain and Hypotension; Risk Factors:Hypertension.  Sonographer:    Juliene Rucks Referring Phys: [phone] REDIA LOISE CLEAVER  Sonographer Comments: Suboptimal subcostal window. IMPRESSIONS  1. Left ventricular ejection fraction, by estimation, is 60 to 65%. The left ventricle has normal function. The left ventricle has no regional wall motion abnormalities. There is mild concentric left ventricular hypertrophy. Left ventricular diastolic parameters are consistent with Grade II diastolic dysfunction (pseudonormalization). Elevated left atrial pressure.  2. Right ventricular systolic function is normal. The right ventricular size is normal.  3. Left atrial size was mildly dilated.  4. The mitral valve is normal in structure. No evidence of mitral valve regurgitation. No evidence of mitral stenosis. Moderate mitral annular calcification.  5. The aortic valve is tricuspid. There is mild calcification of the aortic valve. Aortic valve regurgitation is not visualized. Aortic valve sclerosis/calcification is present, without any evidence of aortic stenosis.  FINDINGS  Left Ventricle: Left ventricular ejection fraction, by estimation, is 60 to 65%. The left ventricle has normal function. The left ventricle has no regional wall motion abnormalities. The left ventricular internal cavity size was normal in size. There is  mild concentric left ventricular hypertrophy. Left ventricular diastolic parameters are consistent with Grade II diastolic dysfunction (pseudonormalization). Elevated left atrial pressure. Right Ventricle: The right ventricular size is normal. No increase in right ventricular wall thickness. Right ventricular systolic function is normal. Left Atrium: Left atrial size was mildly dilated. Right Atrium: Right atrial size was normal in size. Prominent Eustachian valve. Pericardium: There is no evidence of pericardial effusion. Mitral Valve: The mitral valve is normal in structure. Moderate mitral annular calcification. No evidence of mitral valve  regurgitation. No evidence of mitral valve stenosis. Tricuspid Valve: The tricuspid valve is normal in structure. Tricuspid valve regurgitation is not demonstrated. No evidence of tricuspid stenosis. Aortic Valve: The aortic valve is tricuspid. There is mild calcification of the aortic valve. Aortic valve regurgitation is not visualized. Aortic valve sclerosis/calcification is present, without any evidence of aortic stenosis. Pulmonic Valve: The pulmonic valve was normal in structure. Pulmonic valve regurgitation is not visualized. No evidence of pulmonic stenosis. Aorta: The aortic root is normal in size and structure. Venous: The inferior vena cava was not well visualized. IAS/Shunts: No atrial level shunt detected by color flow Doppler.  LEFT VENTRICLE PLAX 2D LVIDd:         4.40 cm      Diastology LVIDs:         3.60 cm      LV e' medial:    5.11 cm/s LV PW:         1.20 cm      LV E/e' medial:  17.8 LV IVS:        1.20 cm      LV e' lateral:   5.55 cm/s LVOT diam:     2.16 cm      LV E/e' lateral: 16.4 LV SV:          90 LV SV Index:   46 LVOT Area:     3.66 cm  LV Volumes (MOD) LV vol d, MOD A2C: 127.0 ml LV vol d, MOD A4C: 94.7 ml LV vol s, MOD A2C: 53.2 ml LV vol s, MOD A4C: 38.4 ml LV SV MOD A2C:     73.8 ml LV SV MOD A4C:     94.7 ml LV SV MOD BP:      64.5 ml RIGHT VENTRICLE RV Basal diam:  3.40 cm RV Mid diam:    3.20 cm LEFT ATRIUM             Index LA Vol (A2C):   69.1 ml 35.20 ml/m LA Vol (A4C):   47.3 ml 24.10 ml/m LA Biplane Vol: 57.6 ml 29.34 ml/m  AORTIC VALVE LVOT Vmax:   112.13 cm/s LVOT Vmean:  73.603 cm/s LVOT VTI:    0.247 m MV E velocity: 90.80 cm/s MV A velocity: 107.21 cm/s  SHUNTS MV E/A ratio:  0.85         Systemic VTI:  0.25 m                             Systemic Diam: 2.16 cm Jerel Croitoru MD Electronically signed by Jerel Balding MD Signature Date/Time: 11/13/2023/2:38:54 PM    Final    CT Angio Chest Pulmonary Embolism (PE) W or WO Contrast Result Date: 11/13/2023 CLINICAL DATA:  Generalized weakness EXAM: CT ANGIOGRAPHY CHEST WITH CONTRAST TECHNIQUE: Multidetector CT imaging of the chest was performed using the standard protocol during bolus administration of intravenous contrast. Multiplanar CT image reconstructions and MIPs were obtained to evaluate the vascular anatomy. RADIATION DOSE REDUCTION: This exam was performed according to the departmental dose-optimization program which includes automated exposure control, adjustment of the mA and/or kV according to patient size and/or use of iterative reconstruction technique. CONTRAST:  75mL OMNIPAQUE  IOHEXOL  350 MG/ML SOLN COMPARISON:  09/02/2018 FINDINGS: Cardiovascular: Heart is at the upper limits of normal in size. Coronary calcifications are noted. No aneurysmal dilatation of the aorta is seen although atherosclerotic calcifications are noted. Evaluation for dissection is somewhat limited  due to the timing of the contrast bolus. The pulmonary artery shows a normal branching pattern bilaterally. No filling defect to suggest pulmonary  embolism is noted. Mediastinum/Nodes: Thoracic inlet is within normal limits. No hilar or mediastinal adenopathy is noted. The esophagus as visualized is within normal limits. Lungs/Pleura: Lungs are well aerated bilaterally. Diffuse emphysematous changes are seen. Some bibasilar atelectatic changes are noted with evidence of inspissated material within the lower lobe bronchial tree. Scattered nodules are noted throughout the right lung particularly in the right upper lobe with the largest of these measuring 13 mm in greatest dimension. This is best seen on image number 64 of series 11. An adjacent somewhat bilobed nodule is noted which measures 11 mm in greatest dimension these were not present on the prior exam and must be viewed as neoplastic in nature. Upper Abdomen: Visualized upper abdomen is within normal limits. Musculoskeletal: Degenerative changes of the thoracic spine are seen. No rib abnormality is noted. Review of the MIP images confirms the above findings. IMPRESSION: No evidence of pulmonary emboli. New pulmonary nodules as described primarily within the right upper lobe. Given the patient's clinical history of head and neck carcinoma this likely represents metastatic disease. Further workup is recommended. Aortic Atherosclerosis (ICD10-I70.0) and Emphysema (ICD10-J43.9). Electronically Signed   By: Oneil Devonshire M.D.   On: 11/13/2023 02:11   CT HEAD WO CONTRAST ( ) Result Date: 11/13/2023 EXAM: CT HEAD WITHOUT CONTRAST 11/13/2023 01:55:30 AM TECHNIQUE: CT of the head was performed without the administration of intravenous contrast. Automated exposure control, iterative reconstruction, and/or weight based adjustment of the mA/kV was utilized to reduce the radiation dose to as low as reasonably achievable. COMPARISON: None available. CLINICAL HISTORY: Mental status change, unknown cause. Patient presents because of weakness as well as some slight altered mental status some lethargy per family. No  nausea or vomiting. No obvious fever but does endorse chills. No pleuritic chest pain or hemoptysis. No recent falls. Maybe some slight dysuria as well. FINDINGS: BRAIN AND VENTRICLES: Patchy and confluent areas of decreased attenuation are noted throughout the deep and periventricular white matter of the cerebral hemispheres bilaterally, suggestive of chronic microvascular ischemic changes. No acute hemorrhage. No evidence of acute infarct. No hydrocephalus. No extra-axial collection. No mass effect or midline shift. Atherosclerotic calcifications are present within the cavernous internal carotid and vertebral arteries. ORBITS: Bilateral lens replacement. SINUSES: No acute abnormality. SOFT TISSUES AND SKULL: No acute soft tissue abnormality. No skull fracture. IMPRESSION: 1. No acute intracranial abnormality. Electronically signed by: Morgane Naveau MD 11/13/2023 02:09 AM EDT RP Workstation: [id number]   US  Abdomen Limited RUQ (LIVER/GB) Result Date: 11/13/2023 EXAM: Right Upper Quadrant Abdominal Ultrasound 11/12/2023 11:55:13 PM TECHNIQUE: Real-time ultrasonography of the right upper quadrant of the abdomen was performed. COMPARISON: CT abdomen and pelvis 10:20:25 CLINICAL HISTORY: Abdominal pain. FINDINGS: LIVER: Increased hepatic parenchyma echogenicity. No intrahepatic biliary ductal dilatation. No evidence of mass. Portal vein is patent with normal directional flow. BILIARY SYSTEM: Calcified gallstones within the gallbladder lumen. No gallbladder wall thickening or pericholecystic fluid. Negative sonographic Murphy's sign. Common bile duct is within normal limits measuring  3 mm . RIGHT KIDNEY: The right kidney is grossly unremarkable in appearances without evidence of hydronephrosis, echogenic calculi or worrisome mass lesions. PANCREAS: Visualized portions of the pancreas are unremarkable. OTHER: No right upper quadrant ascites. IMPRESSION: 1. Cholelithiasis without sonographic evidence of acute  cholecystitis. 2. Hepatic steatosis. Electronically signed by: Morgane Naveau MD 11/13/2023 12:00 AM EDT RP Workstation: [id number]  CT ABDOMEN PELVIS W CONTRAST Result Date: 11/12/2023 EXAM: CT ABDOMEN AND PELVIS WITH CONTRAST 11/12/2023 09:42:31 PM TECHNIQUE: CT of the abdomen and pelvis was performed with the administration of 75 mL iohexol  (OMNIPAQUE ) 350 MG/ML injection. Multiplanar reformatted images are provided for review. Automated exposure control, iterative reconstruction, and/or weight-based adjustment of the mA/kV was utilized to reduce the radiation dose to as low as reasonably achievable. COMPARISON: None available. CLINICAL HISTORY: Epigastric abdominal pain with nausea. Sepsis workup, weakness, AMS. FINDINGS: LOWER CHEST: 4 x 5 mm subpleural right lower lobe pulmonary nodule. Bilateral lower lobe atelectasis. Coronary artery calcifications. LIVER: The liver is unremarkable. GALLBLADDER AND BILE DUCTS: Cholelithiasis. No gallbladder wall thickening or pericholecystic fluid. No biliary ductal dilatation. SPLEEN: No acute abnormality. PANCREAS: No acute abnormality. ADRENAL GLANDS: No acute abnormality. KIDNEYS, URETERS AND BLADDER: Fluid density lesion of the left kidney likely represents simple renal cysts. Per consensus, no follow-up is needed for simple Bosniak type 1 and 2 renal cysts, unless the patient has a malignancy history or risk factors. No filling defects of the partially visualized collecting systems on delayed imaging. No stones in the kidneys or ureters. No hydronephrosis. No perinephric or periureteral stranding. Urinary bladder is unremarkable. GI AND BOWEL: No small or large bowel wall thickening or dilatation. Stomach demonstrates no acute abnormality. The appendix is not definitely identified with no inflammatory changes in the right lower quadrant to suggest acute appendicitis. PERITONEUM AND RETROPERITONEUM: No ascites. No free air. VASCULATURE: Severe atherosclerotic  plaque. Infrarenal abdominal aorta aneurysm measuring up to 3.1 cm. LYMPH NODES: No lymphadenopathy. REPRODUCTIVE ORGANS: No acute abnormality. BONES AND SOFT TISSUES: Partially visualized intramedullary nail fixation of the left femur. No acute osseous abnormality. No focal soft tissue abnormality. IMPRESSION: 1. No acute findings in the abdomen or pelvis. 2. Cholelithiasis with no CT evidence of acute cholecystitis. 3. Infrarenal abdominal aortic aneurysm measuring up to 3.1 cm.  Recommend follow-up ultrasound every 3 years. (Ref.: J Vasc Surg. 2018; 67:2-77 and J Am Coll Radiol 2013;10(10):789-794.)  4. Severe atherosclerotic plaque. 5. Right lower lobe pulmonary micronodule. No further follow-up indicated unless the patient high risk - an optional CT can be obtained at 12 months. Electronically signed by: Morgane Naveau MD 11/12/2023 10:04 PM EDT RP Workstation: [id number]   DG Chest Port 1 View Result Date: 11/12/2023 CLINICAL DATA:  Questionable sepsis. EXAM: PORTABLE CHEST 1 VIEW COMPARISON:  Chest CT from 2020 FINDINGS: The cardiac silhouette, mediastinal and hilar contours are within normal limits. Stable mild tortuosity and calcification of the thoracic aorta. The lungs are clear of an acute process. Streaky left basilar scarring changes again noted. No pleural effusions or pulmonary lesions. IMPRESSION: No acute cardiopulmonary findings. Electronically Signed   By: MYRTIS Stammer M.D.   On: 11/12/2023 18:39     Subjective: - no chest pain, shortness of breath, no abdominal pain, nausea or vomiting.   Discharge Exam: BP 110/69 (BP Location: Right Arm)   Pulse (!) 55   Temp 98.1 F (36.7 C) (Oral)   Resp 14   Ht 5' 11 (1.803 m)   Wt 76.7 kg   SpO2 95%   BMI 23.57 kg/m   General: Pt is alert, awake, not in acute distress Cardiovascular: RRR, S1/S2 +, no rubs, no gallops Respiratory: CTA bilaterally, no wheezing, no rhonchi Abdominal: Soft, NT, ND, bowel sounds + Extremities: no  edema, no cyanosis    The results of significant diagnostics from this hospitalization (including imaging, microbiology, ancillary and laboratory) are listed below  for reference.     Microbiology: Recent Results (from the past 240 hours)  Blood Culture (routine x 2)     Status: None (Preliminary result)   Collection Time: 11/12/23  6:10 PM   Specimen: BLOOD  Result Value Ref Range Status   Specimen Description BLOOD SITE NOT SPECIFIED  Final   Special Requests   Final    BOTTLES DRAWN AEROBIC AND ANAEROBIC Blood Culture results may not be optimal due to an inadequate volume of blood received in culture bottles   Culture   Final    NO GROWTH 3 DAYS Performed at [facility], [address], [place], [place] [address]    Report Status PENDING  Incomplete  Blood Culture (routine x 2)     Status: None (Preliminary result)   Collection Time: 11/12/23  6:31 PM   Specimen: BLOOD  Result Value Ref Range Status   Specimen Description BLOOD SITE NOT SPECIFIED  Final   Special Requests   Final    BOTTLES DRAWN AEROBIC AND ANAEROBIC Blood Culture results may not be optimal due to an inadequate volume of blood received in culture bottles   Culture   Final    NO GROWTH 3 DAYS Performed at [facility], [address] [place], [place] [address]    Report Status PENDING  Incomplete  Resp panel by RT-PCR (RSV, Flu A&B, Covid) Anterior Nasal Swab     Status: None   Collection Time: 11/12/23 10:47 PM   Specimen: Anterior Nasal Swab  Result Value Ref Range Status   SARS Coronavirus 2 by RT PCR NEGATIVE NEGATIVE Final   Influenza A by PCR NEGATIVE NEGATIVE Final   Influenza B by PCR NEGATIVE NEGATIVE Final    Comment: (NOTE) The Xpert Xpress SARS-CoV-2/FLU/RSV plus assay is intended as an aid in the diagnosis of influenza from Nasopharyngeal swab specimens and should not be used as a sole basis for treatment. Nasal washings and aspirates are unacceptable for Xpert Xpress  SARS-CoV-2/FLU/RSV testing.  Fact Sheet for Patients: BloggerCourse.com  Fact Sheet for Healthcare Providers: SeriousBroker.it  This test is not yet approved or cleared by the [place]  FDA and has been authorized for detection and/or diagnosis of SARS-CoV-2 by FDA under an Emergency Use Authorization (EUA). This EUA will remain in effect (meaning this test can be used) for the duration of the COVID-19 declaration under Section 564(b)(1) of the Act, 21 U.S.C. section 360bbb-3(b)(1), unless the authorization is terminated or revoked.     Resp Syncytial Virus by PCR NEGATIVE NEGATIVE Final    Comment: (NOTE) Fact Sheet for Patients: BloggerCourse.com  Fact Sheet for Healthcare Providers: SeriousBroker.it  This test is not yet approved or cleared by the [place]  FDA and has been authorized for detection and/or diagnosis of SARS-CoV-2 by FDA under an Emergency Use Authorization (EUA). This EUA will remain in effect (meaning this test can be used) for the duration of the COVID-19 declaration under Section 564(b)(1) of the Act, 21 U.S.C. section 360bbb-3(b)(1), unless the authorization is terminated or revoked.  Performed at [facility], [address], [place], [place] [address]      Labs: Basic Metabolic Panel: Recent Labs  Lab 11/12/23 1830 11/13/23 0255 11/14/23 0400 11/15/23 0237  NA 139 137 139 140  K 3.9 3.8 3.3* 3.5  CL 107 108 109 110  CO2 23 22 23  21*  GLUCOSE 117* 96 96 102*  BUN 13 12 10 8   CREATININE 1.11 0.97 1.03  0.91  CALCIUM 9.3 8.1* 8.1* 8.1*  MG  --  1.6* 2.1 1.8   Liver Function Tests: Recent Labs  Lab 11/12/23 1830 11/13/23 0255 11/15/23 0237  AST 22 17 25   ALT 21 15 22   ALKPHOS 52 45 44  BILITOT 1.3* 1.4* 0.8  PROT 6.8 5.8* 6.1*  ALBUMIN 3.2* 2.9* 2.8*   CBC: Recent Labs  Lab 11/12/23 1830 11/13/23 0255  11/14/23 0400 11/15/23 0237  WBC 12.8* 11.2* 9.2 8.6  NEUTROABS 9.2* 6.7  --   --   HGB 14.0 12.8* 12.8* 13.0  HCT 39.7 36.6* 36.3* 36.6*  MCV 100.8* 102.2* 100.3* 100.0  PLT 236 208 220 208   CBG: Recent Labs  Lab 11/13/23 0133  GLUCAP 95   Hgb A1c No results for input(s): HGBA1C in the last 72 hours. Lipid Profile No results for input(s): CHOL, HDL, LDLCALC, TRIG, CHOLHDL, LDLDIRECT in the last 72 hours. Thyroid  function studies Recent Labs    11/13/23 0119  TSH 0.744   Urinalysis    Component Value Date/Time   COLORURINE YELLOW 11/12/2023 2032   APPEARANCEUR CLEAR 11/12/2023 2032   LABSPEC 1.018 11/12/2023 2032   PHURINE 6.0 11/12/2023 2032   GLUCOSEU NEGATIVE 11/12/2023 2032   HGBUR NEGATIVE 11/12/2023 2032   BILIRUBINUR NEGATIVE 11/12/2023 2032   KETONESUR NEGATIVE 11/12/2023 2032   PROTEINUR NEGATIVE 11/12/2023 2032   NITRITE NEGATIVE 11/12/2023 2032   LEUKOCYTESUR NEGATIVE 11/12/2023 2032    FURTHER DISCHARGE INSTRUCTIONS:   Get Medicines reviewed and adjusted: Please take all your medications with you for your next visit with your Primary MD   Laboratory/radiological data: Please request your Primary MD to go over all hospital tests and procedure/radiological results at the follow up, please ask your Primary MD to get all Hospital records sent to his/her office.   In some cases, they will be blood work, cultures and biopsy results pending at the time of your discharge. Please request that your primary care M.D. goes through all the records of your hospital data and follows up on these results.   Also Note the following: If you experience worsening of your admission symptoms, develop shortness of breath, life threatening emergency, suicidal or homicidal thoughts you must seek medical attention immediately by calling 911 or calling your MD immediately  if symptoms less severe.   You must read complete instructions/literature along with all  the possible adverse reactions/side effects for all the Medicines you take and that have been prescribed to you. Take any new Medicines after you have completely understood and accpet all the possible adverse reactions/side effects.    Do not drive when taking Pain medications or sleeping medications (Benzodaizepines)   Do not take more than prescribed Pain, Sleep and Anxiety Medications. It is not advisable to combine anxiety,sleep and pain medications without talking with your primary care practitioner   Special Instructions: If you have smoked or chewed Tobacco  in the last 2 yrs please stop smoking, stop any regular Alcohol  and or any Recreational drug use.   Wear Seat belts while driving.   Please note: You were cared for by a hospitalist during your hospital stay. Once you are discharged, your primary care physician will handle any further medical issues. Please note that NO REFILLS for any discharge medications will be authorized once you are discharged, as it is imperative that you return to your primary care physician (or establish a relationship with a primary care physician if you do not have one) for your  post hospital discharge needs so that they can reassess your need for medications and monitor your lab values.  Time coordinating discharge: 35 minutes  SIGNED:  Nilda Fendt, MD, PhD 11/15/2023, 10:24 AM

## 2023-11-15 NOTE — Evaluation (Signed)
 Physical Therapy Evaluation Patient Details Name: Ruben Conrad MRN: [id number] DOB: 1940-10-04 Today's Date: 11/15/2023  History of Present Illness  Pt is a 83 y.o. male who presented to [facility] ED 11/12/23 for epigastric abdominal pain, confusion, generalized weakness, and near syncopal episode. Found to be hypotensive, having urinary retention requiring In-N-Out cath, and with elevated lactic acid and leukocytosis concerning for sepsis. Extensive imaging was fairly unrevealing, he was started empirically on antibiotics. PMHx: CAD s/p PCI, laryngeal carcinoma s/p radiation, and HTN.   Clinical Impression  Pt admitted with above diagnosis. PTA, pt was independent with functional mobility, ADLs/IADLs, and driving. He lives alone in a one story house with 1 STE. Pt currently with functional limitations due to the deficits listed below (see PT Problem List). He performed bed mobility with modI and required close supervision-SBA for transfers and CGA for gait. Pt mobilized without an AD and was unsteady. Discussed using RW vs. SPC for increased support/stability. Assessed orthostatics and pt was positive for orthostatic hypotension, but asymptomatic. He reports his BP runs low and was able to recover to 97/55 (69) with seated rest. Pt will benefit from acute skilled PT to increase his independence and safety with mobility to allow discharge. He declined follow-up PT recommendation for balance training give his moderate fall risk stating he feels like he is at his baseline.    11/15/23 0700  Vital Signs  Patient Position (if appropriate) Orthostatic Vitals  Orthostatic Lying   BP- Lying 128/71  Pulse- Lying 54  Orthostatic Sitting  BP- Sitting 119/68  Pulse- Sitting 60  Orthostatic Standing at 0 minutes  BP- Standing at 0 minutes (!) 73/54  Pulse- Standing at 0 minutes 66  Orthostatic Standing at 3 minutes  BP- Standing at 3 minutes (!) 69/40  Pulse- Standing at 3 minutes 72         If plan  is discharge home, recommend the following: A little help with walking and/or transfers;A little help with bathing/dressing/bathroom;Assistance with cooking/housework;Assist for transportation;Help with stairs or ramp for entrance   Can travel by private vehicle        Equipment Recommendations None recommended by PT  Recommendations for Other Services       Functional Status Assessment Patient has had a recent decline in their functional status and demonstrates the ability to make significant improvements in function in a reasonable and predictable amount of time.     Precautions / Restrictions Precautions Precautions: Fall Recall of Precautions/Restrictions: Intact Precaution/Restrictions Comments: Monitor BP Restrictions Weight Bearing Restrictions Per Provider Order: No      Mobility  Bed Mobility Overal bed mobility: Modified Independent             General bed mobility comments: Pt performed supine>sit from a flat bed with increased time to sit up on R side.    Transfers Overall transfer level: Needs assistance Equipment used: None Transfers: Sit to/from Stand, Bed to chair/wheelchair/BSC Sit to Stand: Supervision   Step pivot transfers: Supervision       General transfer comment: Pt stood from lowest bed height. He pushed up with BUE support. Transferred to recliner chair. Good eccentric control.    Ambulation/Gait Ambulation/Gait assistance: Contact guard assist Gait Distance (Feet): 150 Feet Assistive device: None Gait Pattern/deviations: Step-through pattern, Decreased stride length, Drifts right/left Gait velocity: decreased Gait velocity interpretation: <1.8 ft/sec, indicate of risk for recurrent falls   General Gait Details: Pt ambulated with short slow steps. He demonstrated even weight shift and limited but adequate  foot clearance. Pt was unsteady veering R/L in the hallway. Attempted to introduce dual-tasking with pt instructed to turn his head  multi-directions, but he did not comply, only moving his eyes slightly.  Stairs            Wheelchair Mobility     Tilt Bed    Modified Rankin (Stroke Patients Only)       Balance Overall balance assessment: Needs assistance Sitting-balance support: Bilateral upper extremity supported, Feet supported Sitting balance-Leahy Scale: Fair Sitting balance - Comments: Pt sat EOB with supervision   Standing balance support: No upper extremity supported, During functional activity Standing balance-Leahy Scale: Fair Standing balance comment: Pt ambulated without AD, but was unsteady.                             Pertinent Vitals/Pain Pain Assessment Pain Assessment: No/denies pain    Home Living Family/patient expects to be discharged to:: Private residence Living Arrangements: Alone Available Help at Discharge: Family;Friend(s);Available PRN/intermittently Type of Home: House Home Access: Stairs to enter Entrance Stairs-Rails: None Entrance Stairs-Number of Steps: 1 (threshold)   Home Layout: One level Home Equipment: Grab bars - toilet;Grab bars - tub/shower;Hand held shower head;Shower seat - built in;Cane - single Librarian, academic (2 wheels)      Prior Function Prior Level of Function : Independent/Modified Independent;Driving             Mobility Comments: Ambulates without AD. Denies fall hx. Reports 1 near fall d/t possible syncope prior to admission. ADLs Comments: Indep with ADLs/IADLs. Reports he doesn't live the house much and drives occasionally.     Extremity/Trunk Assessment   Upper Extremity Assessment Upper Extremity Assessment: RUE deficits/detail;LUE deficits/detail RUE Deficits / Details: Pt with tremors throughout UE but primarily in his hand. Decreased grip strength. RUE Sensation: WNL RUE Coordination: decreased gross motor;decreased fine motor LUE Deficits / Details: Pt with tremors throughout UE but primarily in his hand.  Decreased grip strength. Pt unable to make a full fist d/t wound on pinky finger. LUE Sensation: WNL LUE Coordination: decreased gross motor;decreased fine motor    Lower Extremity Assessment Lower Extremity Assessment: Overall WFL for tasks assessed    Cervical / Trunk Assessment Cervical / Trunk Assessment: Normal  Communication   Communication Communication: No apparent difficulties    Cognition Arousal: Alert Behavior During Therapy: WFL for tasks assessed/performed   PT - Cognitive impairments: No apparent impairments                       PT - Cognition Comments: Pt A,Ox4 Following commands: Intact       Cueing Cueing Techniques: Verbal cues     General Comments General comments (skin integrity, edema, etc.): Orthostatic vitals taken, (+) OH. Pt denied dizziness, lightheadedness throughout mobility. He reports his BP runs low typically. Upon sitting in recliner chair BP recovered to 97/55 (69) and HR 58.    Exercises     Assessment/Plan    PT Assessment Patient needs continued PT services  PT Problem List Cardiopulmonary status limiting activity;Decreased balance;Decreased mobility       PT Treatment Interventions DME instruction;Gait training;Stair training;Functional mobility training;Therapeutic activities;Therapeutic exercise;Balance training;Patient/family education    PT Goals (Current goals can be found in the Care Plan section)  Acute Rehab PT Goals Patient Stated Goal: Return Home PT Goal Formulation: With patient Time For Goal Achievement: 11/29/23 Potential to Achieve Goals: Good  Frequency Min 1X/week     Co-evaluation               AM-PAC PT 6 Clicks Mobility  Outcome Measure Help needed turning from your back to your side while in a flat bed without using bedrails?: A Little Help needed moving from lying on your back to sitting on the side of a flat bed without using bedrails?: A Little Help needed moving to and from  a bed to a chair (including a wheelchair)?: A Little Help needed standing up from a chair using your arms (e.g., wheelchair or bedside chair)?: A Little Help needed to walk in hospital room?: A Little Help needed climbing 3-5 steps with a railing? : A Little 6 Click Score: 18    End of Session Equipment Utilized During Treatment: Gait belt Activity Tolerance: Patient tolerated treatment well Patient left: in chair;with call bell/phone within reach;with chair alarm set Nurse Communication: Mobility status;Other (comment) (VS response to activity) PT Visit Diagnosis: Unsteadiness on feet (R26.81)    Time: 9245-9185 PT Time Calculation (min) (ACUTE ONLY): 20 min   Charges:   PT Evaluation $PT Eval Moderate Complexity: 1 Mod   PT General Charges $$ ACUTE PT VISIT: 1 Visit         Randall SAUNDERS, PT, DPT Acute Rehabilitation Services Office: [phone] Secure Chat Preferred  Ruben Conrad 11/15/2023, 9:07 AM

## 2023-11-15 NOTE — Progress Notes (Signed)
 Discharge instructions reviewed with pt and his daughter.  Copy of instructions given to pt/daughter, pt informed his script for his antibiotic was sent to his pharmacy for pick up and pt verbalized understanding of instructions to stop the atenolol  per MD instructions,  to follow up with his PCP, encouraged to monitor his BP/P, write it down and give to his PCP at his visit. Daughter asked about a walker, as PT had mentioned it for mobility when pt walked, (pt's wife had had a walker, but it is being used by another family member, so pt does not have access to it). Spoke with PT, CM RN and MD about a walker for pt,  walker has been ordered and will be sent to the pt's home as pt and daughter request.  Pt will be d/c'd via wheelchair with belongings and will be escorted by staff.  Vung Kush,RN SWOT

## 2023-11-15 NOTE — TOC CM/SW Note (Addendum)
 Received message from discharge nurse patient and daughter want a rolling walker for home. PT added to recommendations. They want rolling walker sent to address in chart. They are aware it will be dropped shipped and take a couple of days . Rolling walker ordered with Jermaine with [employer] . [employer] unable to provide rolling walker due to insurance. Ordered with Zachary with [employer]

## 2023-11-17 LAB — CULTURE, BLOOD (ROUTINE X 2)
Culture: NO GROWTH
Culture: NO GROWTH

## 2023-11-26 DIAGNOSIS — R531 Weakness: Secondary | ICD-10-CM | POA: Diagnosis not present

## 2023-11-26 DIAGNOSIS — I10 Essential (primary) hypertension: Secondary | ICD-10-CM | POA: Diagnosis not present

## 2023-11-26 DIAGNOSIS — I7143 Infrarenal abdominal aortic aneurysm, without rupture: Secondary | ICD-10-CM | POA: Diagnosis not present

## 2023-11-26 DIAGNOSIS — I959 Hypotension, unspecified: Secondary | ICD-10-CM | POA: Diagnosis not present

## 2024-02-08 ENCOUNTER — Emergency Department (HOSPITAL_COMMUNITY)

## 2024-02-08 ENCOUNTER — Encounter (HOSPITAL_COMMUNITY): Payer: Self-pay

## 2024-02-08 ENCOUNTER — Other Ambulatory Visit: Payer: Self-pay

## 2024-02-08 ENCOUNTER — Inpatient Hospital Stay (HOSPITAL_COMMUNITY)
Admission: EM | Admit: 2024-02-08 | Discharge: 2024-02-10 | DRG: 871 | Disposition: A | Discharge status: Home / Self Care | Attending: Internal Medicine | Admitting: Internal Medicine

## 2024-02-08 DIAGNOSIS — Z7982 Long term (current) use of aspirin: Secondary | ICD-10-CM

## 2024-02-08 DIAGNOSIS — Z79899 Other long term (current) drug therapy: Secondary | ICD-10-CM | POA: Diagnosis not present

## 2024-02-08 DIAGNOSIS — G9340 Encephalopathy, unspecified: Secondary | ICD-10-CM | POA: Diagnosis present

## 2024-02-08 DIAGNOSIS — J189 Pneumonia, unspecified organism: Secondary | ICD-10-CM | POA: Diagnosis present

## 2024-02-08 DIAGNOSIS — A419 Sepsis, unspecified organism: Principal | ICD-10-CM | POA: Diagnosis present

## 2024-02-08 DIAGNOSIS — Z87440 Personal history of urinary (tract) infections: Secondary | ICD-10-CM

## 2024-02-08 DIAGNOSIS — I2510 Atherosclerotic heart disease of native coronary artery without angina pectoris: Secondary | ICD-10-CM | POA: Diagnosis present

## 2024-02-08 DIAGNOSIS — Z888 Allergy status to other drugs, medicaments and biological substances status: Secondary | ICD-10-CM | POA: Diagnosis not present

## 2024-02-08 DIAGNOSIS — I252 Old myocardial infarction: Secondary | ICD-10-CM | POA: Diagnosis not present

## 2024-02-08 DIAGNOSIS — K219 Gastro-esophageal reflux disease without esophagitis: Secondary | ICD-10-CM | POA: Diagnosis present

## 2024-02-08 DIAGNOSIS — I10 Essential (primary) hypertension: Secondary | ICD-10-CM | POA: Diagnosis present

## 2024-02-08 DIAGNOSIS — E872 Acidosis, unspecified: Secondary | ICD-10-CM | POA: Diagnosis present

## 2024-02-08 DIAGNOSIS — Z87891 Personal history of nicotine dependence: Secondary | ICD-10-CM

## 2024-02-08 DIAGNOSIS — E7800 Pure hypercholesterolemia, unspecified: Secondary | ICD-10-CM | POA: Diagnosis present

## 2024-02-08 DIAGNOSIS — Z923 Personal history of irradiation: Secondary | ICD-10-CM

## 2024-02-08 DIAGNOSIS — E785 Hyperlipidemia, unspecified: Secondary | ICD-10-CM | POA: Diagnosis present

## 2024-02-08 DIAGNOSIS — Z7902 Long term (current) use of antithrombotics/antiplatelets: Secondary | ICD-10-CM

## 2024-02-08 DIAGNOSIS — Z955 Presence of coronary angioplasty implant and graft: Secondary | ICD-10-CM | POA: Diagnosis not present

## 2024-02-08 DIAGNOSIS — K5900 Constipation, unspecified: Secondary | ICD-10-CM | POA: Diagnosis present

## 2024-02-08 DIAGNOSIS — Z9049 Acquired absence of other specified parts of digestive tract: Secondary | ICD-10-CM | POA: Diagnosis not present

## 2024-02-08 DIAGNOSIS — Z8521 Personal history of malignant neoplasm of larynx: Secondary | ICD-10-CM

## 2024-02-08 DIAGNOSIS — Z9861 Coronary angioplasty status: Secondary | ICD-10-CM

## 2024-02-08 DIAGNOSIS — G9341 Metabolic encephalopathy: Secondary | ICD-10-CM | POA: Diagnosis present

## 2024-02-08 DIAGNOSIS — R531 Weakness: Secondary | ICD-10-CM | POA: Diagnosis not present

## 2024-02-08 LAB — CBC WITH DIFFERENTIAL/PLATELET
Abs Immature Granulocytes: 0.14 K/uL — ABNORMAL HIGH (ref 0.00–0.07)
Basophils Absolute: 0.1 K/uL (ref 0.0–0.1)
Basophils Relative: 1 %
Eosinophils Absolute: 0 K/uL (ref 0.0–0.5)
Eosinophils Relative: 0 %
HCT: 43.9 % (ref 39.0–52.0)
Hemoglobin: 16 g/dL (ref 13.0–17.0)
Immature Granulocytes: 1 %
Lymphocytes Relative: 4 %
Lymphs Abs: 0.6 K/uL — ABNORMAL LOW (ref 0.7–4.0)
MCH: 36.5 pg — ABNORMAL HIGH (ref 26.0–34.0)
MCHC: 36.4 g/dL — ABNORMAL HIGH (ref 30.0–36.0)
MCV: 100.2 fL — ABNORMAL HIGH (ref 80.0–100.0)
Monocytes Absolute: 0.9 K/uL (ref 0.1–1.0)
Monocytes Relative: 6 %
Neutro Abs: 14.5 K/uL — ABNORMAL HIGH (ref 1.7–7.7)
Neutrophils Relative %: 88 %
Platelets: 265 K/uL (ref 150–400)
RBC: 4.38 MIL/uL (ref 4.22–5.81)
RDW: 12.4 % (ref 11.5–15.5)
WBC: 16.4 K/uL — ABNORMAL HIGH (ref 4.0–10.5)
nRBC: 0 % (ref 0.0–0.2)

## 2024-02-08 LAB — COMPREHENSIVE METABOLIC PANEL WITH GFR
ALT: 20 U/L (ref 0–44)
AST: 25 U/L (ref 15–41)
Albumin: 4.2 g/dL (ref 3.5–5.0)
Alkaline Phosphatase: 81 U/L (ref 38–126)
Anion gap: 12 (ref 5–15)
BUN: 12 mg/dL (ref 8–23)
CO2: 23 mmol/L (ref 22–32)
Calcium: 9.5 mg/dL (ref 8.9–10.3)
Chloride: 103 mmol/L (ref 98–111)
Creatinine, Ser: 1.09 mg/dL (ref 0.61–1.24)
GFR, Estimated: >60 mL/min
Glucose, Bld: 137 mg/dL — ABNORMAL HIGH (ref 70–99)
Potassium: 4.3 mmol/L (ref 3.5–5.1)
Sodium: 137 mmol/L (ref 135–145)
Total Bilirubin: 0.9 mg/dL (ref 0.0–1.2)
Total Protein: 7.7 g/dL (ref 6.5–8.1)

## 2024-02-08 LAB — CBG MONITORING, ED: Glucose-Capillary: 150 mg/dL — ABNORMAL HIGH (ref 70–99)

## 2024-02-08 LAB — RESP PANEL BY RT-PCR (RSV, FLU A&B, COVID)  RVPGX2
Influenza A by PCR: NEGATIVE
Influenza B by PCR: NEGATIVE
Resp Syncytial Virus by PCR: NEGATIVE
SARS Coronavirus 2 by RT PCR: NEGATIVE

## 2024-02-08 LAB — I-STAT CG4 LACTIC ACID, ED
Lactic Acid, Venous: 2.7 mmol/L (ref 0.5–1.9)
Lactic Acid, Venous: 2.1 mmol/L (ref 0.5–1.9)

## 2024-02-08 LAB — PROCALCITONIN: Procalcitonin: 0.19 ng/mL

## 2024-02-08 LAB — PROTIME-INR
INR: 1 (ref 0.8–1.2)
Prothrombin Time: 14 s (ref 11.4–15.2)

## 2024-02-08 LAB — TSH: TSH: 1.14 u[IU]/mL (ref 0.350–4.500)

## 2024-02-08 MED ORDER — SENNOSIDES-DOCUSATE SODIUM 8.6-50 MG PO TABS
1.0000 | ORAL_TABLET | Freq: Two times a day (BID) | ORAL | Status: DC
  Administered 2024-02-08 – 2024-02-10 (×4): 1 via ORAL
  Filled 2024-02-08 (×4): qty 1

## 2024-02-08 MED ORDER — SODIUM CHLORIDE 0.9 % IV SOLN
2.0000 g | INTRAVENOUS | Status: DC
  Administered 2024-02-09 – 2024-02-10 (×2): 2 g via INTRAVENOUS
  Filled 2024-02-08 (×2): qty 20

## 2024-02-08 MED ORDER — AZITHROMYCIN 500 MG PO TABS
500.0000 mg | ORAL_TABLET | Freq: Every day | ORAL | Status: AC
  Administered 2024-02-09 – 2024-02-10 (×2): 500 mg via ORAL
  Filled 2024-02-08 (×2): qty 1

## 2024-02-08 MED ORDER — SODIUM CHLORIDE 0.9 % IV BOLUS
250.0000 mL | Freq: Once | INTRAVENOUS | Status: AC
  Administered 2024-02-08: 250 mL via INTRAVENOUS

## 2024-02-08 MED ORDER — ALBUTEROL SULFATE (2.5 MG/3ML) 0.083% IN NEBU: 2.5000 mg | INHALATION_SOLUTION | RESPIRATORY_TRACT | Status: DC | PRN

## 2024-02-08 MED ORDER — SODIUM CHLORIDE 0.9 % IV SOLN
2.0000 g | Freq: Once | INTRAVENOUS | Status: AC
  Administered 2024-02-08: 2 g via INTRAVENOUS
  Filled 2024-02-08: qty 20

## 2024-02-08 MED ORDER — ONDANSETRON HCL 4 MG/2ML IJ SOLN: 4.0000 mg | Freq: Four times a day (QID) | INTRAMUSCULAR | Status: DC | PRN

## 2024-02-08 MED ORDER — CLOPIDOGREL BISULFATE 75 MG PO TABS
75.0000 mg | ORAL_TABLET | Freq: Every day | ORAL | Status: DC
  Administered 2024-02-08 – 2024-02-10 (×3): 75 mg via ORAL
  Filled 2024-02-08 (×3): qty 1

## 2024-02-08 MED ORDER — ASPIRIN 325 MG PO TABS
325.0000 mg | ORAL_TABLET | Freq: Every day | ORAL | Status: DC
  Administered 2024-02-08 – 2024-02-10 (×3): 325 mg via ORAL
  Filled 2024-02-08 (×3): qty 1

## 2024-02-08 MED ORDER — GUAIFENESIN ER 600 MG PO TB12
600.0000 mg | ORAL_TABLET | Freq: Two times a day (BID) | ORAL | Status: DC
  Administered 2024-02-08 – 2024-02-10 (×4): 600 mg via ORAL
  Filled 2024-02-08 (×4): qty 1

## 2024-02-08 MED ORDER — LACTATED RINGERS IV SOLN: INTRAVENOUS | Status: AC

## 2024-02-08 MED ORDER — SODIUM CHLORIDE 0.9% FLUSH
3.0000 mL | Freq: Two times a day (BID) | INTRAVENOUS | Status: DC
  Administered 2024-02-08 – 2024-02-10 (×4): 3 mL via INTRAVENOUS

## 2024-02-08 MED ORDER — SODIUM CHLORIDE 0.9 % IV SOLN: 500.0000 mg | INTRAVENOUS | Status: DC

## 2024-02-08 MED ORDER — ACETAMINOPHEN 325 MG PO TABS
650.0000 mg | ORAL_TABLET | Freq: Once | ORAL | Status: AC
  Administered 2024-02-08: 650 mg via ORAL
  Filled 2024-02-08: qty 2

## 2024-02-08 MED ORDER — ENOXAPARIN SODIUM 40 MG/0.4ML IJ SOSY
40.0000 mg | PREFILLED_SYRINGE | INTRAMUSCULAR | Status: DC
  Administered 2024-02-08 – 2024-02-09 (×2): 40 mg via SUBCUTANEOUS
  Filled 2024-02-08 (×2): qty 0.4

## 2024-02-08 MED ORDER — SMOG ENEMA
400.0000 mL | Freq: Once | RECTAL | Status: DC
  Filled 2024-02-08: qty 960

## 2024-02-08 MED ORDER — FAMOTIDINE 20 MG PO TABS
20.0000 mg | ORAL_TABLET | Freq: Every day | ORAL | Status: DC
  Administered 2024-02-08 – 2024-02-10 (×3): 20 mg via ORAL
  Filled 2024-02-08 (×3): qty 1

## 2024-02-08 MED ORDER — ATORVASTATIN CALCIUM 10 MG PO TABS
20.0000 mg | ORAL_TABLET | Freq: Every day | ORAL | Status: DC
  Administered 2024-02-08 – 2024-02-10 (×3): 20 mg via ORAL
  Filled 2024-02-08 (×3): qty 2

## 2024-02-08 MED ORDER — AZITHROMYCIN 250 MG PO TABS
500.0000 mg | ORAL_TABLET | Freq: Once | ORAL | Status: AC
  Administered 2024-02-08: 500 mg via ORAL
  Filled 2024-02-08: qty 2

## 2024-02-08 MED ORDER — ONDANSETRON HCL 4 MG PO TABS: 4.0000 mg | ORAL_TABLET | Freq: Four times a day (QID) | ORAL | Status: DC | PRN

## 2024-02-08 MED ORDER — LACTATED RINGERS IV BOLUS
1000.0000 mL | Freq: Once | INTRAVENOUS | Status: AC
  Administered 2024-02-08: 1000 mL via INTRAVENOUS

## 2024-02-08 MED ORDER — SODIUM CHLORIDE 0.9 % IV SOLN: 500.0000 mg | Freq: Once | INTRAVENOUS | Status: DC

## 2024-02-08 MED ORDER — LORATADINE 10 MG PO TABS
10.0000 mg | ORAL_TABLET | Freq: Every day | ORAL | Status: DC
  Administered 2024-02-08 – 2024-02-10 (×3): 10 mg via ORAL
  Filled 2024-02-08 (×3): qty 1

## 2024-02-08 MED ORDER — ACETAMINOPHEN 325 MG PO TABS: 650.0000 mg | ORAL_TABLET | Freq: Four times a day (QID) | ORAL | Status: DC | PRN

## 2024-02-08 MED ORDER — ACETAMINOPHEN 650 MG RE SUPP: 650.0000 mg | Freq: Four times a day (QID) | RECTAL | Status: DC | PRN

## 2024-02-08 NOTE — ED Provider Notes (Signed)
 " [place] EMERGENCY DEPARTMENT AT [facility] Provider Note   CSN: [id number] Arrival date & time: 02/08/24  1243     Patient presents with: Altered Mental Status   Ruben Conrad is a 84 y.o. male.   84 year old male with history of CAD s/p PCI, HTN, HLD, tremors presents to the emergency department from home.  He states that he lives with his daughter.  Daughter reportedly called EMS for altered mental status and lethargy.  She reported urinary frequency as well as a history of UTIs.  The patient has no complaints at this time.  Denies chest pain, shortness of breath, abdominal pain, nausea/vomiting.  The history is provided by the patient, medical records and the EMS personnel. No language interpreter was used.  Altered Mental Status      Prior to Admission medications  Medication Sig Start Date End Date Taking? Authorizing Provider  aspirin  325 MG tablet Take 325 mg by mouth daily.    [provider]  atorvastatin  (LIPITOR) 20 MG tablet Take 20 mg by mouth daily.  09/25/17   [provider]  Ca Phosphate-Cholecalciferol (CALTRATE GUMMY BITES) 250-10 MG-MCG CHEW Chew 2 each by mouth daily. 02/07/18   [provider]  clopidogrel  (PLAVIX ) 75 MG tablet Take 75 mg by mouth daily.  11/22/17   [provider]  docusate sodium  (COLACE) 100 MG capsule Take 200 mg by mouth daily.     [provider]  famotidine  (PEPCID ) 20 MG tablet Take 20 mg by mouth daily.    [provider]    Allergies: Primidone and Propranolol    Review of Systems Ten systems reviewed and are negative for acute change, except as noted in the HPI.    Updated Vital Signs BP 113/64   Pulse 90   Temp 98.7 F (37.1 C) (Oral)   Resp 20   Ht 6' 2 (1.88 m)   Wt 81.2 kg   SpO2 98%   BMI 22.98 kg/m   Physical Exam Vitals and nursing note reviewed.  Constitutional:      General: He is not in acute distress.    Appearance: He is  well-developed. He is not diaphoretic.     Comments: Elderly male. Pleasant and making jokes.  HENT:     Head: Normocephalic and atraumatic.  Eyes:     General: No scleral icterus.    Conjunctiva/sclera: Conjunctivae normal.  Cardiovascular:     Rate and Rhythm: Regular rhythm. Tachycardia present.     Pulses: Normal pulses.     Comments: HR low 100's Pulmonary:     Effort: Pulmonary effort is normal. No respiratory distress.     Breath sounds: No stridor.     Comments: Respirations even and unlabored Musculoskeletal:        General: Normal range of motion.     Cervical back: Normal range of motion.  Skin:    General: Skin is warm and dry.     Coloration: Skin is not pale.     Findings: No erythema or rash.  Neurological:     Mental Status: He is alert.     Coordination: Coordination normal.     Comments: Alert, answers questions appropriately.  Psychiatric:        Behavior: Behavior normal.     (all labs ordered are listed, but only abnormal results are displayed) Labs Reviewed  CBC WITH DIFFERENTIAL/PLATELET - Abnormal; Notable for the following components:      Result Value   WBC  16.4 (*)    MCV 100.2 (*)    MCH 36.5 (*)    MCHC 36.4 (*)    Neutro Abs 14.5 (*)    Lymphs Abs 0.6 (*)    Abs Immature Granulocytes 0.14 (*)    All other components within normal limits  COMPREHENSIVE METABOLIC PANEL WITH GFR - Abnormal; Notable for the following components:   Glucose, Bld 137 (*)    All other components within normal limits  CBG MONITORING, ED - Abnormal; Notable for the following components:   Glucose-Capillary 150 (*)    All other components within normal limits  I-STAT CG4 LACTIC ACID, ED - Abnormal; Notable for the following components:   Lactic Acid, Venous 2.7 (*)    All other components within normal limits  RESP PANEL BY RT-PCR (RSV, FLU A&B, COVID)  RVPGX2  CULTURE, BLOOD (ROUTINE X 2)  CULTURE, BLOOD (ROUTINE X 2)  PROTIME-INR  URINALYSIS, ROUTINE W  REFLEX MICROSCOPIC  I-STAT CG4 LACTIC ACID, ED    EKG: None  Radiology: DG Chest Port 1 View Result Date: 02/08/2024 CLINICAL DATA:  Altered mental status EXAM: PORTABLE CHEST 1 VIEW COMPARISON:  November 12, 2023 FINDINGS: Stable cardiomediastinal silhouette. Hypoinflation of the lungs is noted. Ill-defined right upper lobe opacity is noted laterally concerning for possible pneumonia. Bony thorax is unremarkable. IMPRESSION: Ill-defined right upper lobe opacity is noted concerning for possible pneumonia. Followup PA and lateral chest X-ray is recommended in 3-4 weeks following trial of antibiotic therapy to ensure resolution and exclude underlying malignancy. Electronically Signed   By: Lynwood Landy Raddle M.D.   On: 02/08/2024 13:34     .Critical Care  Performed by: Keith Sor, PA-C Authorized by: Keith Sor, PA-C   Critical care provider statement:    Critical care time (minutes):  30   Critical care time was exclusive of:  Separately billable procedures and treating other patients   Critical care was necessary to treat or prevent imminent or life-threatening deterioration of the following conditions:  Sepsis   Critical care was time spent personally by me on the following activities:  Development of treatment plan with patient or surrogate, discussions with consultants, evaluation of patient's response to treatment, examination of patient, ordering and review of laboratory studies, ordering and review of radiographic studies, ordering and performing treatments and interventions, pulse oximetry, re-evaluation of patient's condition and review of old charts   I assumed direction of critical care for this patient from another provider in my specialty: no     Care discussed with: admitting provider      Medications Ordered in the ED  lactated ringers  infusion ( Intravenous New Bag/Given 02/08/24 1444)  lactated ringers  bolus 1,000 mL (0 mLs Intravenous Stopped 02/08/24 1444)  acetaminophen   (TYLENOL ) tablet 650 mg (650 mg Oral Given 02/08/24 1309)  cefTRIAXone  (ROCEPHIN ) 2 g in sodium chloride  0.9 % 100 mL IVPB (0 g Intravenous Stopped 02/08/24 1435)  azithromycin  (ZITHROMAX ) tablet 500 mg (500 mg Oral Given 02/08/24 1402)    Clinical Course as of 02/08/24 1452  Fri Feb 08, 2024  1353 Patient with sepsis criteria with temperature of 100.5 on arrival, heart rate of 102 bpm, leukocytosis of 16.4.  His lactate is slightly elevated at 2.7.  Will continue to hydrate with IV fluids.  Appears source is right upper lobe pneumonia, though daughter was reporting urinary symptoms at home.  Started on Rocephin  and azithromycin  pending urinalysis.  Plan for admission. [KH]  67 Called daughter to update on disposition. [  KH]  1451 Case discussed with Dr. Claudene of [facility] who will assess for admission. [KH]    Clinical Course User Index [KH] Keith Sor, PA-C                                 Medical Decision Making Amount and/or Complexity of Data Reviewed Labs: ordered. Radiology: ordered. ECG/medicine tests: ordered.  Risk OTC drugs. Prescription drug management. Decision regarding hospitalization.   This patient presents to the ED for concern of altered mental status, this involves an extensive number of treatment options, and is a complaint that carries with it a high risk of complications and morbidity.  The differential diagnosis includes polypharmacy vs CVA vs metabolic derangement vs sepsis/bacteremia vs UTI vs dehydration   Co morbidities that complicate the patient evaluation  CAD HTN HLD   Additional history obtained:  Additional history obtained from EMS personnel External records from outside source obtained and reviewed including prior discharge summaries.   Lab Tests:  I Ordered, and personally interpreted labs.  The pertinent results include:  WBC 16.4 w/left shift, Lactic 2.7.   Imaging Studies ordered:  I ordered imaging studies including CXR  I  independently visualized and interpreted imaging which showed RUL pneumonia. I agree with the radiologist interpretation   Cardiac Monitoring:  The patient was maintained on a cardiac monitor.  I personally viewed and interpreted the cardiac monitored which showed an underlying rhythm of: sinus tachycardia > NSR   Medicines ordered and prescription drug management:  I ordered medication including Rocephin  and Azithromycin  for CAP, Tylenol  for fever  Reevaluation of the patient after these medicines showed that the patient improved I have reviewed the patients home medicines and have made adjustments as needed   Critical Interventions:  IV abx IVF   Consultations Obtained:  I requested consultation with the hospitalist and discussed lab and imaging findings as well as pertinent plan - they will assess in the ED for admission.   Problem List / ED Course:  As above   Reevaluation:  After the interventions noted above, I reevaluated the patient and found that they have :stayed the same   Social Determinants of Health:  Lives with daughter   Dispostion:  After consideration of the diagnostic results and the patients response to treatment, I feel that the patent would benefit from admission for further management of suspected sepsis secondary to CAP. Flu and COVID negative. Started on abx in the ED. Hospitalist to admit.       Final diagnoses:  Sepsis, due to unspecified organism, unspecified whether acute organ dysfunction present [facility])  Acute encephalopathy  Community acquired pneumonia of right upper lobe of lung    ED Discharge Orders     None          Keith Sor, PA-C 02/08/24 1453  "

## 2024-02-08 NOTE — ED Triage Notes (Addendum)
 Per EMS pt from home, pt daughter called for pt AMS, lethargy, increased urinary frequency. Pt has hx of UTI. Pt AAOX4 on arrival.

## 2024-02-08 NOTE — Progress Notes (Signed)
 Elink following for sepsis protocol.

## 2024-02-08 NOTE — H&P (Addendum)
 " History and Physical    Patient: Ruben Conrad FMW:[id number] DOB: [birth year] DOA: 02/08/2024 DOS: the patient was seen and examined on 02/08/2024 PCP: Sun, Vyvyan, MD  Patient coming from: Home via EMS  Chief Complaint:  Chief Complaint  Patient presents with   Altered Mental Status   HPI: Ruben Conrad is a 84 y.o. male with medical history significant of hypertension, hyperlipidemia, CAD s/p PCI, laryngeal cancer  s/p radiation, and tremor presents with weakness and confusion.  He is accompanied by his daughter present at bedside  He experienced a sudden onset of weakness and confusion this morning. He woke up at 6:00 AM feeling extremely cold while in his recliner, prompting him to move to his bed. Despite this, he continued to feel unwell and achy. He attempted to drink fluids but was unable to swallow, although he had no throat pain. His daughter, who checks on him daily, noted fluctuating blood pressure readings. He has been off his blood pressure medication since his last hospitalization for lethargy back in October 2025 due to low blood pressure readings. This morning, he was particularly unstable when attempting to stand and walk to the bathroom, requiring assistance and showing signs of confusion, such as difficulty sitting down and speaking incoherently. He typically uses a cane occasionally and is usually independent in his activities of daily living, including using the bathroom and taking showers. However, today he required significant assistance from his daughter. No recent cough, chest pain, urinary discomfort, or shortness of breath. He feels constipated, but has been using stool softeners at home.  Upon admission into the emergency department patient was noted to be febrile up to 100.5 F, pulse 90-1 02, respirations 18-22, and all other vital signs maintained.  Labs significant for WBC 16.4, lactic acid 2.7-> 2.1.  Chest x-ray revealed ill-defined right upper lobe opacity  concerning for possible pneumonia.  Urinalysis was ordered and pending.  Patient was given acetaminophen  650 mg p.o., 1 L of lactated Ringer 's, Rocephin , and azithromycin .  Review of Systems: As mentioned in the history of present illness. All other systems reviewed and are negative. Past Medical History:  Diagnosis Date   Cancer [facility])    head/neck   Coronary artery disease    with stents   History of radiation therapy 04/15/18- 05/22/18   Head and neck/ Larynx 28 fractions. 2.25 Gy each for total of 63 Gy.    Hypercholesteremia    Hypertension    Myocardial infarction (HCC)    Tremors of nervous system    Past Surgical History:  Procedure Laterality Date   APPENDECTOMY     CORONARY ANGIOPLASTY WITH STENT PLACEMENT     DIRECT LARYNGOSCOPY N/A 02/25/2018   Procedure: SUSPENDED DIRECT MICROLARYNGOSCOPY WITH CO2 LASER VOCAL CORD BIOPSY;  Surgeon: Carlie Clark, MD;  Location: [facility] OR;  Service: ENT;  Laterality: N/A;   HIP FRACTURE SURGERY Left 2018   SKIN SURGERY  10/2021   Social History:  reports that he quit smoking about 12 years ago. His smoking use included cigarettes. He started smoking about 62 years ago. He has never used smokeless tobacco. He reports that he does not drink alcohol and does not use drugs.  Allergies[1]  Family History  Problem Relation Age of Onset   Cancer Father        prostate    Prior to Admission medications  Medication Sig Start Date End Date Taking? Authorizing Provider  aspirin  325 MG tablet Take 325 mg by mouth daily.  [provider]  atorvastatin  (LIPITOR) 20 MG tablet Take 20 mg by mouth daily.  09/25/17   [provider]  Ca Phosphate-Cholecalciferol (CALTRATE GUMMY BITES) 250-10 MG-MCG CHEW Chew 2 each by mouth daily. 02/07/18   [provider]  clopidogrel  (PLAVIX ) 75 MG tablet Take 75 mg by mouth daily.  11/22/17   [provider]  docusate sodium  (COLACE) 100 MG capsule Take 200 mg by mouth daily.      [provider]  famotidine  (PEPCID ) 20 MG tablet Take 20 mg by mouth daily.    [provider]    Physical Exam: Vitals:   02/08/24 1250 02/08/24 1253 02/08/24 1400 02/08/24 1402  BP: 115/74  114/76 113/64  Pulse: (!) 102  94 90  Resp: (!) 22  18 20   Temp: (!) 100.5 F (38.1 C)   98.7 F (37.1 C)  TempSrc: Oral   Oral  SpO2:   97% 98%  Weight:  81.2 kg    Height:  6' 2 (1.88 m)      Constitutional: Elderly male who appears ill but in no acute distress and able to follow commands Eyes: PERRL, lids and conjunctivae normal ENMT: Mucous membranes are moist.  Normal dentition.  Mildly hard of hearing. Neck: normal, supple  Respiratory: clear to auscultation bilaterally, no wheezing, no crackles. Normal respiratory effort. No accessory muscle use.  Cardiovascular: Regular rate and rhythm, no murmurs / rubs / gallops. No extremity edema. 2+ pedal pulses. No carotid bruits.  Abdomen: no tenderness to palpation.  Bowel sounds positive.  Musculoskeletal: no clubbing / cyanosis. No joint deformity upper and lower extremities. Good ROM, no contractures. Normal muscle tone.  Skin: no rashes, lesions, ulcers. No induration Neurologic: CN 2-12 grossly intact.  Strength 4/5 in all 4 extremities. Psychiatric: Normal judgment and insight. Alert and oriented x 3. Normal mood.   Data Reviewed:  EKG revealed normal sinus rhythm at 87 bpm with first-degree heart block.  Reviewed labs, imaging, and pertinent records as documented.   Assessment and Plan:   Sepsis due to pneumonia Patient presented was noted to be febrile up to 100.5 F with tachycardia and tachypnea.  Labs revealed leukocytosis of 16.4 with lactic acidosis 2.7.  O2 saturations were noted to be maintained on room air chest x-ray concerning for right upper lobe infiltrate.  Patient had been given a bolus of IV fluids, acetaminophen , Rocephin , and azithromycin .  Repeat lactic acid trending down at 2.1. - Admit to a  telemetry bed - Incentive spirometry and flutter valve - Follow-up blood cultures - Check procalcitonin, urine Legionella, urine strep - Continue empiric antibiotics of Rocephin  and azithromycin  - Mucinex  - Continue to trend lactic acid level - Recheck CBC tomorrow morning  Acute metabolic encephalopathy Patient noted to be acutely altered this morning as reported by his daughter and was not able to do things he normally could.  Normally lives independently with his daughter come and check on him throughout the day. - Neurochecks - Delirium precautions - Follow-up urinalysis  Generalized weakness Patient noted to be significantly weak and having difficulty ambulating.  Normally able to ambulate with intermittent use of a cane. - Check TSH - Physical therapy to eval and treat  Constipation Patient reports having difficulty having a bowel movement despite being on stool softeners at home. - Monitor intake and output - Senokot-S twice daily - Smog enema  Coronary artery disease Patient denies any complaints of chest pain.  Prior history of PCI performed in [place] . -  Continue full dose aspirin , Plavix , and statin  Essential hypertension Patient no longer on blood pressure medications patient due to hypotension during hospitalization in 10/2023. - Continue to monitor  History of laryngeal cancer Patient status post radiation therapy - Continue outpatient follow-up  Hyperlipidemia - Continue atorvastatin   GERD - Continue Pepcid    DVT prophylaxis: Lovenox   Advance Care Planning:   Code Status: Full Code   Consults: None Family Communication: Daughter updated  Severity of Illness: The appropriate patient status for this patient is INPATIENT. Inpatient status is judged to be reasonable and necessary in order to provide the required intensity of service to ensure the patient's safety. The patient's presenting symptoms, physical exam findings, and initial radiographic and  laboratory data in the context of their chronic comorbidities is felt to place them at high risk for further clinical deterioration. Furthermore, it is not anticipated that the patient will be medically stable for discharge from the hospital within 2 midnights of admission.   * I certify that at the point of admission it is my clinical judgment that the patient will require inpatient hospital care spanning beyond 2 midnights from the point of admission due to high intensity of service, high risk for further deterioration and high frequency of surveillance required.*  Author: Maximino DELENA Sharps, MD 02/08/2024 2:50 PM  For on call review [email or web address].      [1]  Allergies Allergen Reactions   Primidone Hives   Propranolol Hives   "

## 2024-02-09 LAB — CBC
HCT: 34.6 % — ABNORMAL LOW (ref 39.0–52.0)
Hemoglobin: 12.2 g/dL — ABNORMAL LOW (ref 13.0–17.0)
MCH: 35.2 pg — ABNORMAL HIGH (ref 26.0–34.0)
MCHC: 35.3 g/dL (ref 30.0–36.0)
MCV: 99.7 fL (ref 80.0–100.0)
Platelets: 191 K/uL (ref 150–400)
RBC: 3.47 MIL/uL — ABNORMAL LOW (ref 4.22–5.81)
RDW: 12.6 % (ref 11.5–15.5)
WBC: 11.5 K/uL — ABNORMAL HIGH (ref 4.0–10.5)
nRBC: 0 % (ref 0.0–0.2)

## 2024-02-09 LAB — URINALYSIS, ROUTINE W REFLEX MICROSCOPIC
Bilirubin Urine: NEGATIVE
Glucose, UA: NEGATIVE mg/dL
Hgb urine dipstick: NEGATIVE
Ketones, ur: NEGATIVE mg/dL
Leukocytes,Ua: NEGATIVE
Nitrite: NEGATIVE
Protein, ur: NEGATIVE mg/dL
Specific Gravity, Urine: 1.013 (ref 1.005–1.030)
pH: 7 (ref 5.0–8.0)

## 2024-02-09 LAB — BASIC METABOLIC PANEL WITH GFR
Anion gap: 10 (ref 5–15)
BUN: 13 mg/dL (ref 8–23)
CO2: 22 mmol/L (ref 22–32)
Calcium: 8.5 mg/dL — ABNORMAL LOW (ref 8.9–10.3)
Chloride: 108 mmol/L (ref 98–111)
Creatinine, Ser: 0.85 mg/dL (ref 0.61–1.24)
GFR, Estimated: >60 mL/min
Glucose, Bld: 91 mg/dL (ref 70–99)
Potassium: 3.5 mmol/L (ref 3.5–5.1)
Sodium: 141 mmol/L (ref 135–145)

## 2024-02-09 LAB — STREP PNEUMONIAE URINARY ANTIGEN: Strep Pneumo Urinary Antigen: NEGATIVE

## 2024-02-09 LAB — GLUCOSE, CAPILLARY: Glucose-Capillary: 99 mg/dL (ref 70–99)

## 2024-02-09 NOTE — Evaluation (Signed)
 Physical Therapy Evaluation Patient Details Name: Ruben Conrad MRN: [id number] DOB: November 01, 1940 Today's Date: 02/09/2024  History of Present Illness  84 y.o. male admitted 02/08/24 with weakness, confusion. Workup for acute metabolic encephalopathy, sepsis, PNA. Of note, admission 11/12/23 with weakness, syncope, hypotension. PMH includes head/neck cancer (s/p radiation), CAD, HTN, MI, tremors.   Clinical Impression  Pt presents with an overall decrease in functional mobility secondary to above. PTA, pt lives alone, mod indep with intermittent use of SPC, drives occasionally; daughter lives nearby and visits near-daily to assist with meals and household tasks. Today, pt able to transfer and ambulate with intermittent CGA for balance; denies lightheadedness, notable improvements in cognition. Pt would benefit from continued acute PT services to maximize functional mobility and independence prior to d/c home.       If plan is discharge home, recommend the following: Assistance with cooking/housework;Assist for transportation   Can travel by private vehicle    Yes    Equipment Recommendations None recommended by PT  Recommendations for Other Services       Functional Status Assessment Patient has had a recent decline in their functional status and demonstrates the ability to make significant improvements in function in a reasonable and predictable amount of time.     Precautions / Restrictions Precautions Precautions: Fall Recall of Precautions/Restrictions: Intact Precaution/Restrictions Comments: h/o orthostatic hypotension Restrictions Weight Bearing Restrictions Per Provider Order: No      Mobility  Bed Mobility Overal bed mobility: Independent                  Transfers Overall transfer level: Independent                      Ambulation/Gait Ambulation/Gait assistance: Contact guard assist Gait Distance (Feet): 120 Feet Assistive device: None Gait  Pattern/deviations: Step-through pattern, Decreased stride length, Drifts right/left Gait velocity: Decreased     General Gait Details: slow, mildly unsteady gait without DME, intermittent CGA for 2x bouts self-corrected LOB; pt declines further distance, no reason specified; pt declines DME use  Stairs            Wheelchair Mobility     Tilt Bed    Modified Rankin (Stroke Patients Only)       Balance Overall balance assessment: Needs assistance Sitting-balance support: No upper extremity supported Sitting balance-Leahy Scale: Good     Standing balance support: No upper extremity supported, During functional activity Standing balance-Leahy Scale: Good Standing balance comment: can void standing at toilet without overt instability or LOB; ambulating without UE support             High level balance activites: Head turns, Direction changes, Turns High Level Balance Comments: some increased instability noted with higher level balance tasks, no overt LOB             Pertinent Vitals/Pain Pain Assessment Pain Assessment: No/denies pain    Home Living Family/patient expects to be discharged to:: Private residence Living Arrangements: Alone Available Help at Discharge: Family;Friend(s);Available PRN/intermittently Type of Home: House Home Access: Stairs to enter Entrance Stairs-Rails: None Entrance Stairs-Number of Steps: threshold   Home Layout: One level Home Equipment: Grab bars - toilet;Grab bars - tub/shower;Hand held shower head;Shower seat - built in;Cane - single Librarian, Academic (2 wheels)      Prior Function Prior Level of Function : Independent/Modified Independent;Driving             Mobility Comments: ambulates without DME, intermittent use of SPC.  primarily sedentary ADLs Comments: reports indep ADLs, majority of iADLs. daughter lives nearby and visits near-daily for meal prep and household management. pt drives occasionally but does  not leave house much     Extremity/Trunk Assessment   Upper Extremity Assessment Upper Extremity Assessment: Overall WFL for tasks assessed (intermittent BUE/BLE tremors, pt reports baseline since age 89 y.o. and they get a little worse every year)    Lower Extremity Assessment Lower Extremity Assessment: Overall WFL for tasks assessed (intermittent BUE/BLE tremors, pt reports baseline since age 40 y.o. and they get a little worse every year)       Communication   Communication Communication: No apparent difficulties    Cognition Arousal: Alert Behavior During Therapy: WFL for tasks assessed/performed   PT - Cognitive impairments: No apparent impairments                       PT - Cognition Comments: WFL for simple tasks, not formally assessed Following commands: Intact       Cueing       General Comments General comments (skin integrity, edema, etc.): educ re: role of acute PT, POC, activity recommendations, importance of mobility, potential d/c needs    Exercises     Assessment/Plan    PT Assessment Patient needs continued PT services  PT Problem List Decreased activity tolerance;Decreased balance;Decreased strength;Decreased mobility;Decreased knowledge of use of DME       PT Treatment Interventions DME instruction;Gait training;Stair training;Functional mobility training;Therapeutic activities;Therapeutic exercise;Balance training;Patient/family education    PT Goals (Current goals can be found in the Care Plan section)  Acute Rehab PT Goals Patient Stated Goal: return home PT Goal Formulation: With patient Time For Goal Achievement: 02/23/24 Potential to Achieve Goals: Good    Frequency Min 2X/week     Co-evaluation               AM-PAC PT 6 Clicks Mobility  Outcome Measure Help needed turning from your back to your side while in a flat bed without using bedrails?: None Help needed moving from lying on your back to sitting on  the side of a flat bed without using bedrails?: None Help needed moving to and from a bed to a chair (including a wheelchair)?: None Help needed standing up from a chair using your arms (e.g., wheelchair or bedside chair)?: None Help needed to walk in hospital room?: A Little Help needed climbing 3-5 steps with a railing? : A Little 6 Click Score: 22    End of Session Equipment Utilized During Treatment: Gait belt Activity Tolerance: Patient tolerated treatment well Patient left: in chair;with call bell/phone within reach;with chair alarm set Nurse Communication: Mobility status PT Visit Diagnosis: Other abnormalities of gait and mobility (R26.89)    Time: 9242-9184 PT Time Calculation (min) (ACUTE ONLY): 18 min   Charges:   PT Evaluation $PT Eval Moderate Complexity: 1 Mod   PT General Charges $$ ACUTE PT VISIT: 1 Visit       Darice Almas, PT, DPT Acute Rehabilitation Services  Personal: Secure Chat Rehab Office: [phone]  Darice LITTIE Almas 02/09/2024, 10:02 AM

## 2024-02-09 NOTE — Progress Notes (Signed)
 " Progress Note   Patient: Ruben Conrad FMW:[id number] DOB: 10-13-1940 DOA: 02/08/2024     1 DOS: the patient was seen and examined on 02/09/2024    Brief hospital course: Ruben Conrad is a 84 y.o. male with medical history significant of hypertension, hyperlipidemia, CAD s/p PCI, laryngeal cancer  s/p radiation, and tremor presents with weakness and confusion.  Diagnosed with sepsis due to pneumonia and started on antibiotics.  Assessment and Plan:  Sepsis due to pneumonia Patient presented was noted to be febrile up to 100.5 F with tachycardia and tachypnea.  Labs revealed leukocytosis of 16.4 with lactic acidosis 2.7.  O2 saturations were noted to be maintained on room air chest x-ray concerning for right upper lobe infiltrate.   UA unremarkable.  -Pneumonia treated as indicated below.  Community-acquired pneumonia Chest x-ray done on admission showed a right upper lobe opacity concerning for possible pneumonia. Patient had been given a bolus of IV fluids, acetaminophen , Rocephin , and azithromycin .  Repeat lactic acid trended down. Procalcitonin: 0.19. Strep pneumonia antigen negative - Incentive spirometry and flutter valve - Follow-up blood cultures -Follow-up urine Legionella antigen. - Continue empiric antibiotics of Rocephin  and azithromycin    Acute metabolic encephalopathy Patient noted to be acutely altered on the morning of presentation as reported by his daughter and was not able to do things he normally could.  Normally lives independently with his daughter come and check on him throughout the day. Patient appears back to baseline.  - Neurochecks - Delirium precautions   Generalized weakness Patient noted to be significantly weak and having difficulty ambulating.  Normally able to ambulate with intermittent use of a cane. - Physical therapy to eval and treat   Constipation Patient reports having difficulty having a bowel movement despite being on stool softeners  at home. - Monitor intake and output - Senokot-S twice daily - Smog enema   Coronary artery disease Patient denies any complaints of chest pain.  Prior history of PCI performed in [place] . - Continue full dose aspirin , Plavix , and statin   Essential hypertension Patient no longer on blood pressure medications patient due to hypotension during hospitalization in 10/2023. - Continue to monitor   History of laryngeal cancer Patient status post radiation therapy - Continue outpatient follow-up   Hyperlipidemia - Continue atorvastatin    GERD - Continue Pepcid        Subjective: Patient states he is feeling much better.  Alert and oriented x 3.  Physical Exam: BP 106/63 (BP Location: Left Arm)   Pulse 64   Temp (!) 97.4 F (36.3 C)   Resp 18   Ht 6' 2 (1.88 m)   Wt 81.2 kg   SpO2 99%   BMI 22.98 kg/m    General: Alert, oriented X3  Eyes: Pupils equal, reactive  Oral cavity: moist mucous membranes  Head: Atraumatic, normocephalic  Neck: supple  Chest: clear to auscultation. No crackles, no wheezes  CVS: S1,S2 RRR. No murmurs  Abd: No distention, soft, non-tender. No masses palpable  Extr: No edema   MSK: No joint deformities or swelling  Neurological: Grossly intact.    Data Reviewed:    Latest Ref Rng & Units 02/09/2024    5:44 AM 02/08/2024   12:59 PM 11/15/2023    2:37 AM  CBC  WBC 4.0 - 10.5 K/uL 11.5  16.4  8.6   Hemoglobin 13.0 - 17.0 g/dL 87.7  83.9  86.9   Hematocrit 39.0 - 52.0 % 34.6  43.9  36.6   Platelets  150 - 400 K/uL 191  265  208       Latest Ref Rng & Units 02/09/2024    5:44 AM 02/08/2024   12:59 PM 11/15/2023    2:37 AM  BMP  Glucose 70 - 99 mg/dL 91  862  897   BUN 8 - 23 mg/dL 13  12  8    Creatinine 0.61 - 1.24 mg/dL 9.14  8.90  9.08   Sodium 135 - 145 mmol/L 141  137  140   Potassium 3.5 - 5.1 mmol/L 3.5  4.3  3.5   Chloride 98 - 111 mmol/L 108  103  110   CO2 22 - 32 mmol/L 22  23  21    Calcium  8.9 - 10.3 mg/dL 8.5  9.5  8.1       Family Communication: n/a  Disposition: Status is: Inpatient Remains inpatient appropriate because: On IV antibiotics for sepsis due to pneumonia.  DVT PPx: SQ Lovenox .        Author: MDALA-GAUSI, Ruben Mcmanamon AGATHA, MD 02/09/2024 1:51 PM  For on call review [email or web address].    "

## 2024-02-10 LAB — CBC
HCT: 34.8 % — ABNORMAL LOW (ref 39.0–52.0)
Hemoglobin: 12.3 g/dL — ABNORMAL LOW (ref 13.0–17.0)
MCH: 35.8 pg — ABNORMAL HIGH (ref 26.0–34.0)
MCHC: 35.3 g/dL (ref 30.0–36.0)
MCV: 101.2 fL — ABNORMAL HIGH (ref 80.0–100.0)
Platelets: 194 K/uL (ref 150–400)
RBC: 3.44 MIL/uL — ABNORMAL LOW (ref 4.22–5.81)
RDW: 12.7 % (ref 11.5–15.5)
WBC: 7.6 K/uL (ref 4.0–10.5)
nRBC: 0 % (ref 0.0–0.2)

## 2024-02-10 LAB — BASIC METABOLIC PANEL WITH GFR
Anion gap: 9 (ref 5–15)
BUN: 13 mg/dL (ref 8–23)
CO2: 25 mmol/L (ref 22–32)
Calcium: 8.7 mg/dL — ABNORMAL LOW (ref 8.9–10.3)
Chloride: 107 mmol/L (ref 98–111)
Creatinine, Ser: 0.93 mg/dL (ref 0.61–1.24)
GFR, Estimated: >60 mL/min
Glucose, Bld: 92 mg/dL (ref 70–99)
Potassium: 3.5 mmol/L (ref 3.5–5.1)
Sodium: 141 mmol/L (ref 135–145)

## 2024-02-10 MED ORDER — LORATADINE 10 MG PO TABS: 10.0000 mg | ORAL_TABLET | Freq: Every day | ORAL | 0 refills | Status: AC | PRN

## 2024-02-10 MED ORDER — AMOXICILLIN-POT CLAVULANATE 875-125 MG PO TABS: 1.0000 | ORAL_TABLET | Freq: Two times a day (BID) | ORAL | 0 refills | Status: AC

## 2024-02-10 NOTE — Discharge Summary (Signed)
 " Physician Discharge Summary   Patient: Ruben Conrad MRN: [id number] DOB: 1940-06-26  Admit date:     02/08/2024  Discharge date: 02/10/24  Discharge Physician: MDALA-GAUSI, Frederick Marro AGATHA   PCP: Sun, Vyvyan, MD   Recommendations at discharge:   Follow-up with PCP  Discharge Diagnoses: Principal Problem:   Sepsis due to pneumonia [facility]) Active Problems:   Acute metabolic encephalopathy   Generalized weakness   Constipation   CAD S/P percutaneous coronary angioplasty   Essential hypertension   History of laryngeal cancer   Hyperlipidemia   GERD (gastroesophageal reflux disease)  Resolved Problems:   * No resolved hospital problems. *  Hospital Course: Ruben Conrad is a 84 y.o. male with medical history significant of hypertension, hyperlipidemia, CAD s/p PCI, laryngeal cancer  s/p radiation, and tremor presents with weakness and confusion.  Diagnosed with sepsis due to pneumonia and started on antibiotics.  The hospital course is in problem-based format below:    Assessment and Plan:  Sepsis due to pneumonia Patient presented was noted to be febrile up to 100.5 F with tachycardia and tachypnea.  Labs revealed leukocytosis of 16.4 with lactic acidosis 2.7.   O2 saturations were noted to be maintained on room air chest x-ray concerning for right upper lobe infiltrate.   UA unremarkable.  Sepsis resolved with treatment of pneumonia.   Community-acquired pneumonia Chest x-ray done on admission showed a right upper lobe opacity concerning for possible pneumonia. Patient had been given a bolus of IV fluids, acetaminophen , Rocephin , and azithromycin .  Repeat lactic acid trended down. Procalcitonin: 0.19.  Blood cultures with no growth. Strep pneumonia antigen negative Urine Legionella antigen pending at discharge Patient was treated with Rocephin , azithromycin  while hospitalized and transition to Augmentin  at discharge.   Acute metabolic encephalopathy Patient noted to  be acutely altered on the morning of presentation as reported by his daughter and was not able to do things he normally could.   Normally lives independently. Patient returned to his mental baseline.    Generalized weakness Patient noted to be significantly weak and having difficulty ambulating on presentation.   Normally able to ambulate with intermittent use of a cane. Weakness resolved and he returned to his baseline. He was seen by PT and was cleared for discharge home with no further therapies.   Coronary artery disease Patient denied any complaints of chest pain.  Prior history of PCI performed in [place] . Continued full dose aspirin , Plavix , and statin.   Essential hypertension, now with persistent hypotension Patient no longer on blood pressure medications patient due to hypotension during hospitalization in 10/2023. Patient's daughter reported that the patient has continued having frequent episodes of hypotension with systolic pressures in the 80s at home, despite discontinuation of antihypertensives. Of note, patient's blood pressure has remained normal here after his initial presentation with hypotension. Given the fact that Zyrtec does have the rare side effect of hypotension, his Zyrtec has been discontinued and as needed loratadine  for allergies has been ordered instead. Patient is to follow-up with his PCP.   History of laryngeal cancer Patient status post radiation therapy Continue outpatient follow-up   Hyperlipidemia Continued atorvastatin    GERD Continued Pepcid       Consultants: n/a Procedures performed: n/a  Disposition: Home Diet recommendation:  Discharge Diet Orders (From admission, onward)     Start     Ordered   02/10/24 0000  Diet general        02/10/24 1358  Regular diet DISCHARGE MEDICATION: Allergies as of 02/10/2024       Reactions   Primidone Hives   Propranolol Hives        Medication List     STOP taking these  medications    cetirizine 10 MG tablet Commonly known as: ZYRTEC Replaced by: loratadine  10 MG tablet       TAKE these medications    amoxicillin -clavulanate 875-125 MG tablet Commonly known as: AUGMENTIN  Take 1 tablet by mouth 2 (two) times daily for 2 days. Start taking on: February 11, 2024   aspirin  325 MG tablet Take 325 mg by mouth daily.   atorvastatin  20 MG tablet Commonly known as: LIPITOR Take 20 mg by mouth daily.   Caltrate Gummy Bites 250-10 MG-MCG Chew Generic drug: Ca Phosphate-Cholecalciferol Chew 2 each by mouth daily.   CENTRUM ADULTS MULTIGUMMIES PO Take 2 tablets by mouth daily.   clopidogrel  75 MG tablet Commonly known as: PLAVIX  Take 75 mg by mouth daily.   docusate sodium  100 MG capsule Commonly known as: COLACE Take 200 mg by mouth daily.   famotidine  20 MG tablet Commonly known as: PEPCID  Take 20 mg by mouth daily.   loratadine  10 MG tablet Commonly known as: CLARITIN  Take 1 tablet (10 mg total) by mouth daily as needed for allergies. Replaces: cetirizine 10 MG tablet   VIACTIV CALCIUM  PLUS D PO Take 2 tablets by mouth daily.        Discharge Exam: Filed Weights   02/08/24 1253 02/08/24 1808 02/08/24 1813  Weight: 81.2 kg 81.2 kg 81.2 kg   Physical Exam on Day of Discharge   General: Alert, cheerful, oriented X3  Oral cavity: moist mucous membranes  Neck: supple  Chest: clear to auscultation. No crackles, no wheezes  CVS: S1,S2 RRR. No murmurs  Abd: No distention, soft, non-tender. No masses palpable  Extr: No edema    Condition at discharge: stable  The results of significant diagnostics from this hospitalization (including imaging, microbiology, ancillary and laboratory) are listed below for reference.   Imaging Studies: DG Chest Port 1 View Result Date: 02/08/2024 CLINICAL DATA:  Altered mental status EXAM: PORTABLE CHEST 1 VIEW COMPARISON:  November 12, 2023 FINDINGS: Stable cardiomediastinal silhouette.  Hypoinflation of the lungs is noted. Ill-defined right upper lobe opacity is noted laterally concerning for possible pneumonia. Bony thorax is unremarkable. IMPRESSION: Ill-defined right upper lobe opacity is noted concerning for possible pneumonia. Followup PA and lateral chest X-ray is recommended in 3-4 weeks following trial of antibiotic therapy to ensure resolution and exclude underlying malignancy. Electronically Signed   By: Lynwood Landy Raddle M.D.   On: 02/08/2024 13:34    Microbiology: Results for orders placed or performed during the hospital encounter of 02/08/24  Blood Culture (routine x 2)     Status: None (Preliminary result)   Collection Time: 02/08/24 12:59 PM   Specimen: BLOOD RIGHT HAND  Result Value Ref Range Status   Specimen Description BLOOD RIGHT HAND  Final   Special Requests   Final    BOTTLES DRAWN AEROBIC AND ANAEROBIC Blood Culture results may not be optimal due to an inadequate volume of blood received in culture bottles   Culture   Final    NO GROWTH 2 DAYS Performed at [facility], [address], [place], [place] [address]    Report Status PENDING  Incomplete  Resp panel by RT-PCR (RSV, Flu A&B, Covid)     Status: None   Collection Time: 02/08/24  12:59 PM   Specimen: Nasal Swab  Result Value Ref Range Status   SARS Coronavirus 2 by RT PCR NEGATIVE NEGATIVE Final   Influenza A by PCR NEGATIVE NEGATIVE Final   Influenza B by PCR NEGATIVE NEGATIVE Final    Comment: (NOTE) The Xpert Xpress SARS-CoV-2/FLU/RSV plus assay is intended as an aid in the diagnosis of influenza from Nasopharyngeal swab specimens and should not be used as a sole basis for treatment. Nasal washings and aspirates are unacceptable for Xpert Xpress SARS-CoV-2/FLU/RSV testing.  Fact Sheet for Patients: bloggercourse.com  Fact Sheet for Healthcare Providers: seriousbroker.it  This test is not yet approved or cleared by the [place]  [place] FDA and has been authorized for detection and/or diagnosis of SARS-CoV-2 by FDA under an Emergency Use Authorization (EUA). This EUA will remain in effect (meaning this test can be used) for the duration of the COVID-19 declaration under Section 564(b)(1) of the Act, 21 U.S.C. section 360bbb-3(b)(1), unless the authorization is terminated or revoked.     Resp Syncytial Virus by PCR NEGATIVE NEGATIVE Final    Comment: (NOTE) Fact Sheet for Patients: bloggercourse.com  Fact Sheet for Healthcare Providers: seriousbroker.it  This test is not yet approved or cleared by the [place]  FDA and has been authorized for detection and/or diagnosis of SARS-CoV-2 by FDA under an Emergency Use Authorization (EUA). This EUA will remain in effect (meaning this test can be used) for the duration of the COVID-19 declaration under Section 564(b)(1) of the Act, 21 U.S.C. section 360bbb-3(b)(1), unless the authorization is terminated or revoked.  Performed at [facility], [address], [place], [place] [address]   Blood Culture (routine x 2)     Status: None (Preliminary result)   Collection Time: 02/08/24  1:04 PM   Specimen: BLOOD  Result Value Ref Range Status   Specimen Description BLOOD RIGHT ANTECUBITAL  Final   Special Requests   Final    BOTTLES DRAWN AEROBIC AND ANAEROBIC Blood Culture adequate volume   Culture   Final    NO GROWTH 2 DAYS Performed at [facility], [address] [place], [place] [address]    Report Status PENDING  Incomplete    Labs: CBC: Recent Labs  Lab 02/08/24 1259 02/09/24 0544 02/10/24 0339  WBC 16.4* 11.5* 7.6  NEUTROABS 14.5*  --   --   HGB 16.0 12.2* 12.3*  HCT 43.9 34.6* 34.8*  MCV 100.2* 99.7 101.2*  PLT 265 191 194   Basic Metabolic Panel: Recent Labs  Lab 02/08/24 1259 02/09/24 0544 02/10/24 0339  NA 137 141 141  K 4.3 3.5 3.5  CL 103 108 107  CO2 23 22 25    GLUCOSE 137* 91 92  BUN 12 13 13   CREATININE 1.09 0.85 0.93  CALCIUM  9.5 8.5* 8.7*   Liver Function Tests: Recent Labs  Lab 02/08/24 1259  AST 25  ALT 20  ALKPHOS 81  BILITOT 0.9  PROT 7.7  ALBUMIN 4.2   CBG: Recent Labs  Lab 02/08/24 1256 02/09/24 0739  GLUCAP 150* 99    Discharge time spent: greater than 30 minutes.  Signed: MDALA-GAUSI, Darlean Warmoth AGATHA, MD [facility] 02/10/2024 "

## 2024-02-10 NOTE — Plan of Care (Signed)
   Problem: Education: Goal: Knowledge of General Education information will improve Description Including pain rating scale, medication(s)/side effects and non-pharmacologic comfort measures Outcome: Progressing   Problem: Health Behavior/Discharge Planning: Goal: Ability to manage health-related needs will improve Outcome: Progressing

## 2024-02-10 NOTE — Progress Notes (Signed)
 Reviewed AVS, patient expressed understanding of medications, MD follow up reviewed.   Removed IV, Site clean, dry and intact.  Patient states all belongings brought to the hospital at time of admission are accounted for and packed to take home.  Patient informed and expressed understanding where to pick up discharge medications.  Nursing staff contacted to transport patient to entrance A, where family member was waiting in vehicle to transport home.

## 2024-02-10 NOTE — TOC CM/SW Note (Signed)
 Transition of Care [facility]) - Inpatient Brief Assessment   Patient Details  Name: Ruben Conrad MRN: [id number] Date of Birth: 03-22-1940  Transition of Care [facility]) CM/SW Contact:    Sudie Erminio Deems, RN Phone Number: 02/10/2024, 2:17 PM   Clinical Narrative: Patient presented for altered mental status. PTA patient was from home alone. Patient has support of daughter that is at the bedside. Patient has DME cane and rolling walker in the home and he does not have any HH Services in the home.  Daughter will schedule hospital follow up appointment with Dr. Austin within the next 2 weeks. Daughter will transport patient home via private vehicle. No further needs identified at this time.    Transition of Care Asessment: Insurance and Status: Insurance coverage has been reviewed Patient has primary care physician: Yes Home environment has been reviewed: reviewed- lives alone uses rliing walker and cane. Prior level of function:: independent Prior/Current Home Services: No current home services Social Drivers of Health Review: SDOH reviewed no interventions necessary Readmission risk has been reviewed: Yes Transition of care needs: no transition of care needs at this time

## 2024-02-12 LAB — LEGIONELLA PNEUMOPHILA SEROGP 1 UR AG: L. pneumophila Serogp 1 Ur Ag: NEGATIVE

## 2024-02-13 LAB — CULTURE, BLOOD (ROUTINE X 2)
Culture: NO GROWTH
Culture: NO GROWTH
Special Requests: ADEQUATE
